# Patient Record
Sex: Male | Born: 1970 | Race: White | Hispanic: No | Marital: Married | Smoking: Never smoker
Health system: Southern US, Community
[De-identification: ages and names within clinical notes are randomized; demographics above are authoritative.]

## PROBLEM LIST (undated history)

## (undated) DIAGNOSIS — M8700 Idiopathic aseptic necrosis of unspecified bone: Secondary | ICD-10-CM

## (undated) DIAGNOSIS — I10 Essential (primary) hypertension: Secondary | ICD-10-CM

## (undated) DIAGNOSIS — S7290XA Unspecified fracture of unspecified femur, initial encounter for closed fracture: Secondary | ICD-10-CM

## (undated) DIAGNOSIS — K219 Gastro-esophageal reflux disease without esophagitis: Secondary | ICD-10-CM

## (undated) HISTORY — DX: Gastro-esophageal reflux disease without esophagitis: K21.9

---

## 2000-12-23 ENCOUNTER — Encounter: Payer: Self-pay | Admitting: *Deleted

## 2000-12-23 ENCOUNTER — Emergency Department (HOSPITAL_COMMUNITY): Admission: EM | Admit: 2000-12-23 | Discharge: 2000-12-24 | Payer: Self-pay | Admitting: *Deleted

## 2003-05-22 ENCOUNTER — Emergency Department (HOSPITAL_COMMUNITY): Admission: EM | Admit: 2003-05-22 | Discharge: 2003-05-22 | Payer: Self-pay | Admitting: Emergency Medicine

## 2003-05-22 ENCOUNTER — Encounter: Payer: Self-pay | Admitting: Emergency Medicine

## 2003-06-26 ENCOUNTER — Emergency Department (HOSPITAL_COMMUNITY): Admission: EM | Admit: 2003-06-26 | Discharge: 2003-06-26 | Payer: Self-pay | Admitting: Emergency Medicine

## 2003-07-23 ENCOUNTER — Emergency Department (HOSPITAL_COMMUNITY): Admission: AD | Admit: 2003-07-23 | Discharge: 2003-07-23 | Payer: Self-pay | Admitting: Family Medicine

## 2003-07-24 ENCOUNTER — Emergency Department (HOSPITAL_COMMUNITY): Admission: EM | Admit: 2003-07-24 | Discharge: 2003-07-24 | Payer: Self-pay | Admitting: Emergency Medicine

## 2004-03-18 ENCOUNTER — Emergency Department (HOSPITAL_COMMUNITY): Admission: EM | Admit: 2004-03-18 | Discharge: 2004-03-18 | Payer: Self-pay | Admitting: Emergency Medicine

## 2004-07-26 ENCOUNTER — Emergency Department (HOSPITAL_COMMUNITY): Admission: EM | Admit: 2004-07-26 | Discharge: 2004-07-26 | Payer: Self-pay | Admitting: Emergency Medicine

## 2005-03-25 ENCOUNTER — Emergency Department (HOSPITAL_COMMUNITY): Admission: EM | Admit: 2005-03-25 | Discharge: 2005-03-25 | Payer: Self-pay | Admitting: Emergency Medicine

## 2005-05-19 ENCOUNTER — Emergency Department (HOSPITAL_COMMUNITY): Admission: EM | Admit: 2005-05-19 | Discharge: 2005-05-19 | Payer: Self-pay | Admitting: Emergency Medicine

## 2005-06-16 ENCOUNTER — Emergency Department (HOSPITAL_COMMUNITY): Admission: EM | Admit: 2005-06-16 | Discharge: 2005-06-16 | Payer: Self-pay | Admitting: Emergency Medicine

## 2005-06-23 ENCOUNTER — Emergency Department (HOSPITAL_COMMUNITY): Admission: EM | Admit: 2005-06-23 | Discharge: 2005-06-23 | Payer: Self-pay | Admitting: Emergency Medicine

## 2005-07-12 ENCOUNTER — Emergency Department (HOSPITAL_COMMUNITY): Admission: EM | Admit: 2005-07-12 | Discharge: 2005-07-12 | Payer: Self-pay | Admitting: Emergency Medicine

## 2005-08-02 ENCOUNTER — Emergency Department (HOSPITAL_COMMUNITY): Admission: EM | Admit: 2005-08-02 | Discharge: 2005-08-02 | Payer: Self-pay | Admitting: Emergency Medicine

## 2005-09-07 ENCOUNTER — Emergency Department (HOSPITAL_COMMUNITY): Admission: EM | Admit: 2005-09-07 | Discharge: 2005-09-07 | Payer: Self-pay | Admitting: *Deleted

## 2005-10-04 ENCOUNTER — Emergency Department (HOSPITAL_COMMUNITY): Admission: EM | Admit: 2005-10-04 | Discharge: 2005-10-04 | Payer: Self-pay | Admitting: Emergency Medicine

## 2006-01-26 ENCOUNTER — Emergency Department (HOSPITAL_COMMUNITY): Admission: EM | Admit: 2006-01-26 | Discharge: 2006-01-26 | Payer: Self-pay | Admitting: Emergency Medicine

## 2006-08-09 ENCOUNTER — Emergency Department (HOSPITAL_COMMUNITY): Admission: EM | Admit: 2006-08-09 | Discharge: 2006-08-09 | Payer: Self-pay | Admitting: Emergency Medicine

## 2006-09-23 ENCOUNTER — Emergency Department (HOSPITAL_COMMUNITY): Admission: EM | Admit: 2006-09-23 | Discharge: 2006-09-23 | Payer: Self-pay | Admitting: Emergency Medicine

## 2006-11-27 ENCOUNTER — Emergency Department (HOSPITAL_COMMUNITY): Admission: EM | Admit: 2006-11-27 | Discharge: 2006-11-28 | Payer: Self-pay | Admitting: Emergency Medicine

## 2007-02-06 ENCOUNTER — Emergency Department (HOSPITAL_COMMUNITY): Admission: EM | Admit: 2007-02-06 | Discharge: 2007-02-06 | Payer: Self-pay | Admitting: Emergency Medicine

## 2007-03-02 ENCOUNTER — Emergency Department (HOSPITAL_COMMUNITY): Admission: EM | Admit: 2007-03-02 | Discharge: 2007-03-02 | Payer: Self-pay | Admitting: Emergency Medicine

## 2007-07-02 ENCOUNTER — Emergency Department (HOSPITAL_COMMUNITY): Admission: EM | Admit: 2007-07-02 | Discharge: 2007-07-02 | Payer: Self-pay | Admitting: Emergency Medicine

## 2007-09-09 ENCOUNTER — Emergency Department (HOSPITAL_COMMUNITY): Admission: EM | Admit: 2007-09-09 | Discharge: 2007-09-09 | Payer: Self-pay | Admitting: Emergency Medicine

## 2007-09-15 ENCOUNTER — Emergency Department (HOSPITAL_COMMUNITY): Admission: EM | Admit: 2007-09-15 | Discharge: 2007-09-15 | Payer: Self-pay | Admitting: Emergency Medicine

## 2007-10-16 ENCOUNTER — Emergency Department (HOSPITAL_COMMUNITY): Admission: EM | Admit: 2007-10-16 | Discharge: 2007-10-16 | Payer: Self-pay | Admitting: Emergency Medicine

## 2007-11-04 ENCOUNTER — Emergency Department (HOSPITAL_COMMUNITY): Admission: EM | Admit: 2007-11-04 | Discharge: 2007-11-04 | Payer: Self-pay | Admitting: Emergency Medicine

## 2007-11-05 ENCOUNTER — Emergency Department (HOSPITAL_COMMUNITY): Admission: EM | Admit: 2007-11-05 | Discharge: 2007-11-05 | Payer: Self-pay | Admitting: Emergency Medicine

## 2007-11-29 ENCOUNTER — Emergency Department (HOSPITAL_COMMUNITY): Admission: EM | Admit: 2007-11-29 | Discharge: 2007-11-29 | Payer: Self-pay | Admitting: Emergency Medicine

## 2007-12-16 ENCOUNTER — Emergency Department (HOSPITAL_COMMUNITY): Admission: EM | Admit: 2007-12-16 | Discharge: 2007-12-16 | Payer: Self-pay | Admitting: Emergency Medicine

## 2008-03-13 ENCOUNTER — Emergency Department (HOSPITAL_COMMUNITY): Admission: EM | Admit: 2008-03-13 | Discharge: 2008-03-13 | Payer: Self-pay | Admitting: Emergency Medicine

## 2008-10-17 ENCOUNTER — Emergency Department (HOSPITAL_COMMUNITY): Admission: EM | Admit: 2008-10-17 | Discharge: 2008-10-17 | Payer: Self-pay | Admitting: Emergency Medicine

## 2010-09-16 DIAGNOSIS — S7290XA Unspecified fracture of unspecified femur, initial encounter for closed fracture: Secondary | ICD-10-CM

## 2010-09-16 HISTORY — PX: FEMUR FRACTURE SURGERY: SHX633

## 2010-09-16 HISTORY — DX: Unspecified fracture of unspecified femur, initial encounter for closed fracture: S72.90XA

## 2011-09-17 HISTORY — PX: FRACTURE SURGERY: SHX138

## 2012-02-21 ENCOUNTER — Encounter (HOSPITAL_COMMUNITY): Payer: Self-pay | Admitting: *Deleted

## 2012-02-21 ENCOUNTER — Emergency Department (HOSPITAL_COMMUNITY)
Admission: EM | Admit: 2012-02-21 | Discharge: 2012-02-21 | Disposition: A | Discharge status: Home / Self Care | Payer: Self-pay | Attending: Emergency Medicine | Admitting: Emergency Medicine

## 2012-02-21 ENCOUNTER — Emergency Department (HOSPITAL_COMMUNITY): Payer: Self-pay

## 2012-02-21 DIAGNOSIS — M25569 Pain in unspecified knee: Secondary | ICD-10-CM | POA: Insufficient documentation

## 2012-02-21 DIAGNOSIS — G8929 Other chronic pain: Secondary | ICD-10-CM | POA: Insufficient documentation

## 2012-02-21 HISTORY — DX: Unspecified fracture of unspecified femur, initial encounter for closed fracture: S72.90XA

## 2012-02-21 MED ORDER — OXYCODONE-ACETAMINOPHEN 5-325 MG PO TABS
2.0000 | ORAL_TABLET | Freq: Once | ORAL | Status: AC
  Administered 2012-02-21: 2 via ORAL
  Filled 2012-02-21: qty 2

## 2012-02-21 MED ORDER — IBUPROFEN 800 MG PO TABS: 800.0000 mg | ORAL_TABLET | Freq: Three times a day (TID) | ORAL | Status: AC

## 2012-02-21 MED ORDER — HYDROCODONE-ACETAMINOPHEN 5-325 MG PO TABS: 2.0000 | ORAL_TABLET | ORAL | Status: AC | PRN

## 2012-02-21 NOTE — ED Notes (Signed)
Pt states that he broke his knee and femur on halloween of this past year. Pt continues to have pain in right knee area.

## 2012-02-21 NOTE — Discharge Instructions (Signed)
Knee Pain Followup with your knee specialist as scheduled. Return to the ED if you have new or worsening symptoms. The knee is the complex joint between your thigh and your lower leg. It is made up of bones, tendons, ligaments, and cartilage. The bones that make up the knee are:  The femur in the thigh.   The tibia and fibula in the lower leg.   The patella or kneecap riding in the groove on the lower femur.  CAUSES  Knee pain is a common complaint with many causes. A few of these causes are:  Injury, such as:   A ruptured ligament or tendon injury.   Torn cartilage.   Medical conditions, such as:   Gout   Arthritis   Infections   Overuse, over training or overdoing a physical activity.  Knee pain can be minor or severe. Knee pain can accompany debilitating injury. Minor knee problems often respond well to self-care measures or get well on their own. More serious injuries may need medical intervention or even surgery. SYMPTOMS The knee is complex. Symptoms of knee problems can vary widely. Some of the problems are:  Pain with movement and weight bearing.   Swelling and tenderness.   Buckling of the knee.   Inability to straighten or extend your knee.   Your knee locks and you cannot straighten it.   Warmth and redness with pain and fever.   Deformity or dislocation of the kneecap.  DIAGNOSIS  Determining what is wrong may be very straight forward such as when there is an injury. It can also be challenging because of the complexity of the knee. Tests to make a diagnosis may include:  Your caregiver taking a history and doing a physical exam.   Routine X-rays can be used to rule out other problems. X-rays will not reveal a cartilage tear. Some injuries of the knee can be diagnosed by:   Arthroscopy a surgical technique by which a small video camera is inserted through tiny incisions on the sides of the knee. This procedure is used to examine and repair internal knee  joint problems. Tiny instruments can be used during arthroscopy to repair the torn knee cartilage (meniscus).   Arthrography is a radiology technique. A contrast liquid is directly injected into the knee joint. Internal structures of the knee joint then become visible on X-ray film.   An MRI scan is a non x-ray radiology procedure in which magnetic fields and a computer produce two- or three-dimensional images of the inside of the knee. Cartilage tears are often visible using an MRI scanner. MRI scans have largely replaced arthrography in diagnosing cartilage tears of the knee.   Blood work.   Examination of the fluid that helps to lubricate the knee joint (synovial fluid). This is done by taking a sample out using a needle and a syringe.  TREATMENT The treatment of knee problems depends on the cause. Some of these treatments are:  Depending on the injury, proper casting, splinting, surgery or physical therapy care will be needed.   Give yourself adequate recovery time. Do not overuse your joints. If you begin to get sore during workout routines, back off. Slow down or do fewer repetitions.   For repetitive activities such as cycling or running, maintain your strength and nutrition.   Alternate muscle groups. For example if you are a weight lifter, work the upper body on one day and the lower body the next.   Either tight or weak muscles do  not give the proper support for your knee. Tight or weak muscles do not absorb the stress placed on the knee joint. Keep the muscles surrounding the knee strong.   Take care of mechanical problems.   If you have flat feet, orthotics or special shoes may help. See your caregiver if you need help.   Arch supports, sometimes with wedges on the inner or outer aspect of the heel, can help. These can shift pressure away from the side of the knee most bothered by osteoarthritis.   A brace called an "unloader" brace also may be used to help ease the pressure  on the most arthritic side of the knee.   If your caregiver has prescribed crutches, braces, wraps or ice, use as directed. The acronym for this is PRICE. This means protection, rest, ice, compression and elevation.   Nonsteroidal anti-inflammatory drugs (NSAID's), can help relieve pain. But if taken immediately after an injury, they may actually increase swelling. Take NSAID's with food in your stomach. Stop them if you develop stomach problems. Do not take these if you have a history of ulcers, stomach pain or bleeding from the bowel. Do not take without your caregiver's approval if you have problems with fluid retention, heart failure, or kidney problems.   For ongoing knee problems, physical therapy may be helpful.   Glucosamine and chondroitin are over-the-counter dietary supplements. Both may help relieve the pain of osteoarthritis in the knee. These medicines are different from the usual anti-inflammatory drugs. Glucosamine may decrease the rate of cartilage destruction.   Injections of a corticosteroid drug into your knee joint may help reduce the symptoms of an arthritis flare-up. They may provide pain relief that lasts a few months. You may have to wait a few months between injections. The injections do have a small increased risk of infection, water retention and elevated blood sugar levels.   Hyaluronic acid injected into damaged joints may ease pain and provide lubrication. These injections may work by reducing inflammation. A series of shots may give relief for as long as 6 months.   Topical painkillers. Applying certain ointments to your skin may help relieve the pain and stiffness of osteoarthritis. Ask your pharmacist for suggestions. Many over the-counter products are approved for temporary relief of arthritis pain.   In some countries, doctors often prescribe topical NSAID's for relief of chronic conditions such as arthritis and tendinitis. A review of treatment with NSAID creams  found that they worked as well as oral medications but without the serious side effects.  PREVENTION  Maintain a healthy weight. Extra pounds put more strain on your joints.   Get strong, stay limber. Weak muscles are a common cause of knee injuries. Stretching is important. Include flexibility exercises in your workouts.   Be smart about exercise. If you have osteoarthritis, chronic knee pain or recurring injuries, you may need to change the way you exercise. This does not mean you have to stop being active. If your knees ache after jogging or playing basketball, consider switching to swimming, water aerobics or other low-impact activities, at least for a few days a week. Sometimes limiting high-impact activities will provide relief.   Make sure your shoes fit well. Choose footwear that is right for your sport.   Protect your knees. Use the proper gear for knee-sensitive activities. Use kneepads when playing volleyball or laying carpet. Buckle your seat belt every time you drive. Most shattered kneecaps occur in car accidents.   Rest when you are  tired.  SEEK MEDICAL CARE IF:  You have knee pain that is continual and does not seem to be getting better.  SEEK IMMEDIATE MEDICAL CARE IF:  Your knee joint feels hot to the touch and you have a high fever. MAKE SURE YOU:   Understand these instructions.   Will watch your condition.   Will get help right away if you are not doing well or get worse.  Document Released: 06/30/2007 Document Revised: 08/22/2011 Document Reviewed: 06/30/2007 [facility] Patient Information 2012 [place], [place].

## 2012-02-21 NOTE — ED Notes (Signed)
Pt c/o chronic pain in his right knee since breaking it in October. States that he has an appt with a specialist at the end of the month. Pt alert and oriented x 3. Skin warm and dry. Color pink.

## 2012-02-21 NOTE — ED Provider Notes (Signed)
History   This chart was scribed for Troy Octave, MD by Charolett Bumpers . The patient was seen in room APFT21/APFT21.    CSN: [id number]  Arrival date & time 02/21/12  1443   First MD Initiated Contact with Patient 02/21/12 1615      Chief Complaint  Patient presents with  . Leg Pain    (Consider location/radiation/quality/duration/timing/severity/associated sxs/prior treatment) HPI Troy Hunt is a 41 y.o. male who presents to the Emergency Department complaining of constant, moderate right knee pain since Oct/2012. Patient states that he fell and broke his right femur in which he had surgery for. Patient states that his pain has been the same pain since the surgery. Patient states that Dr. Lorin Picket at [facility] preformed the surgery and has been following him until 01/25/2012 which was his last check-up. Patient states that Dr. Lorin Picket referred him to a knee specialist, Dr. Emelda Fear. Patient states that he is not currently taking any medications for his knee pain, since he ran out after his last check up. Patient denies any other associated symptoms or injuries at this time. No prior medical or surgical hx reported.     Past Medical History  Diagnosis Date  . Closed femur fracture     Past Surgical History  Procedure Date  . Femur fracture surgery     No family history on file.  History  Substance Use Topics  . Smoking status: Never Smoker   . Smokeless tobacco: Not on file  . Alcohol Use: No      Review of Systems  Constitutional: Negative for fever.  Gastrointestinal: Negative for vomiting.  Genitourinary:       No incontinence.   Musculoskeletal: Negative for joint swelling.       Right knee pain.   Neurological: Negative for weakness and numbness.  All other systems reviewed and are negative.    Allergies  Darvocet; Ultracet; and Ultram  Home Medications   Current Outpatient Rx  Name Route Sig Dispense Refill  . NAPROXEN SODIUM 220 MG PO TABS  Oral Take 220 mg by mouth 2 (two) times daily as needed. Pain    . HYDROCODONE-ACETAMINOPHEN 5-325 MG PO TABS Oral Take 2 tablets by mouth every 4 (four) hours as needed for pain. 10 tablet 0  . IBUPROFEN 800 MG PO TABS Oral Take 1 tablet (800 mg total) by mouth 3 (three) times daily. 21 tablet 0    BP 178/106  Pulse 95  Temp(Src) 98 F (36.7 C) (Oral)  Resp 20  Ht 5\' 9"  (1.753 m)  Wt 204 lb (92.534 kg)  BMI 30.13 kg/m2  SpO2 98%  Physical Exam  Nursing note and vitals reviewed. Constitutional: He is oriented to person, place, and time. He appears well-developed and well-nourished. No distress.  HENT:  Head: Normocephalic and atraumatic.  Eyes: EOM are normal. Pupils are equal, round, and reactive to light.  Neck: Neck supple. No tracheal deviation present.  Cardiovascular: Normal rate.   Pulmonary/Chest: Effort normal. No respiratory distress.  Abdominal: Soft. He exhibits no distension.  Musculoskeletal: Normal range of motion. He exhibits no edema.       Right knee: He exhibits normal range of motion, no effusion, no LCL laxity and no MCL laxity.       Crepitus right lateral knee. Well healed scar to Rt anterior knee, right lateral thigh. Full ROM with no effusion. No ligament laxity. No weakness   Neurological: He is alert and oriented to person, place, and time.  No sensory deficit.  Skin: Skin is warm and dry.  Psychiatric: He has a normal mood and affect. His behavior is normal.    ED Course  Procedures (including critical care time)  DIAGNOSTIC STUDIES: Oxygen Saturation is 98% on room air, normal by my interpretation.    COORDINATION OF CARE:  1624: Discussed planned course of treatment with the patient, who is agreeable at this time.  1630: Medication Orders: Oxycodone-acetaminophen (Percocet) 5-325 mg per tablet 2 tablet-once.    Labs Reviewed - No data to display Dg Femur Right  02/21/2012  *RADIOLOGY REPORT*  Clinical Data: Right thigh and knee pain.  RIGHT  FEMUR - 2 VIEW  Comparison: None.  Findings: Two-view exam of the right femur shows no evidence for fracture.  Lateral plate screw fixation device is noted, without evidence for hardware complications.  No evidence for joint effusion.  IMPRESSION: Status post ORIF.  No acute findings.  Original Report Authenticated By: ERIC A. MANSELL, M.D.   Dg Knee Complete 4 Views Right  02/21/2012  *RADIOLOGY REPORT*  Clinical Data: Knee pain.  RIGHT KNEE - COMPLETE 4+ VIEW  Comparison: None.  Findings: The study was reviewed along with the femur films obtained at the same time.  No fracture.  No hardware complications in this patient with a lateral plate and screw fixation device on the distal femur.  No evidence for joint effusion.  IMPRESSION: No acute bony findings.  Original Report Authenticated By: ERIC A. MANSELL, M.D.     1. Chronic knee pain       MDM  Chronic R leg and knee pain since surgery in Oct 2012.  No new injury. Incisions well healed. No effusion, weakness, numbness, tingling, fever. Crepitance to R knee on exam without ligament laxity. No evidence of septic joint.  No hardware failure on Xray. Follow up with knee specialist at [facility] as scheduled on June 21. Provided with short course of pain meds.  I personally performed the services described in this documentation, which was scribed in my presence.  The recorded information has been reviewed and considered.       Troy Octave, MD 02/22/12 1209

## 2012-09-16 HISTORY — PX: JOINT REPLACEMENT: SHX530

## 2012-12-10 ENCOUNTER — Emergency Department (HOSPITAL_COMMUNITY)
Admission: EM | Admit: 2012-12-10 | Discharge: 2012-12-10 | Disposition: A | Discharge status: Home / Self Care | Payer: Self-pay | Attending: Emergency Medicine | Admitting: Emergency Medicine

## 2012-12-10 ENCOUNTER — Encounter (HOSPITAL_COMMUNITY): Payer: Self-pay

## 2012-12-10 DIAGNOSIS — G8929 Other chronic pain: Secondary | ICD-10-CM | POA: Insufficient documentation

## 2012-12-10 DIAGNOSIS — Z8781 Personal history of (healed) traumatic fracture: Secondary | ICD-10-CM | POA: Insufficient documentation

## 2012-12-10 DIAGNOSIS — M2550 Pain in unspecified joint: Secondary | ICD-10-CM | POA: Insufficient documentation

## 2012-12-10 MED ORDER — OXYCODONE-ACETAMINOPHEN 5-325 MG PO TABS: 1.0000 | ORAL_TABLET | Freq: Four times a day (QID) | ORAL | Status: DC | PRN

## 2012-12-10 MED ORDER — DICLOFENAC SODIUM 75 MG PO TBEC: 75.0000 mg | DELAYED_RELEASE_TABLET | Freq: Two times a day (BID) | ORAL | Status: DC

## 2012-12-10 NOTE — ED Notes (Signed)
Pt reports that he has been out of his percocet for pain since feb 27th, was last seen by his pmd  feb 11th, and they will not see him anymore due to lack of insurance.  Has chronic right leg pain from injury 1 year ago that required surgery.

## 2012-12-10 NOTE — ED Provider Notes (Signed)
Medical screening examination/treatment/procedure(s) were performed by non-physician practitioner and as supervising physician I was immediately available for consultation/collaboration.  Joelys Staubs L Alyan Hartline, MD 12/10/12 1613 

## 2012-12-10 NOTE — ED Provider Notes (Signed)
History     CSN: [id number]  Arrival date & time 12/10/12  1202   First MD Initiated Contact with Patient 12/10/12 1243      Chief Complaint  Patient presents with  . Medication Refill    (Consider location/radiation/quality/duration/timing/severity/associated sxs/prior treatment) HPI Comments: Patient is a 42 year old male who unfortunately sustained a fracture of the right femur proximally 1-1/2 years ago. He has been followed with an orthopedic specialist, and a" knee specialist", and a pain management person up until February 11th.. Patient states that he lost his insurance and after that did not see the doctors in [place]. It is of note that his sister works in [place] and has found a physician at a clinic with [facility] that will see the patient. The patient is scheduled for appointment, but states that he cannot take the pain between now and the appointment time. He had been taking Percocet for his pain, but has not had this medication since February 27. Patient presents to the emergency department to request assistance with his pain.  The history is provided by the patient.    Past Medical History  Diagnosis Date  . Closed femur fracture     Past Surgical History  Procedure Laterality Date  . Femur fracture surgery      No family history on file.  History  Substance Use Topics  . Smoking status: Never Smoker   . Smokeless tobacco: Not on file  . Alcohol Use: No      Review of Systems  Constitutional: Negative for activity change.       All ROS Neg except as noted in HPI  HENT: Negative for nosebleeds and neck pain.   Eyes: Negative for photophobia and discharge.  Respiratory: Negative for cough, shortness of breath and wheezing.   Cardiovascular: Negative for chest pain and palpitations.  Gastrointestinal: Negative for abdominal pain and blood in stool.  Genitourinary: Negative for dysuria, frequency and hematuria.  Musculoskeletal: Positive for  arthralgias. Negative for back pain.  Skin: Negative.   Neurological: Negative for dizziness, seizures and speech difficulty.  Psychiatric/Behavioral: Negative for hallucinations and confusion.    Allergies  Darvocet; Ultracet; and Ultram  Home Medications   Current Outpatient Rx  Name  Route  Sig  Dispense  Refill  . naproxen sodium (ALEVE) 220 MG tablet   Oral   Take 220 mg by mouth 2 (two) times daily as needed. Pain           BP 174/100  Pulse 102  Temp(Src) 97.6 F (36.4 C) (Oral)  Resp 20  Ht 5\' 9"  (1.753 m)  Wt 200 lb (90.719 kg)  BMI 29.52 kg/m2  SpO2 100%  Physical Exam  Nursing note and vitals reviewed. Constitutional: He is oriented to person, place, and time. He appears well-developed and well-nourished.  Non-toxic appearance.  HENT:  Head: Normocephalic.  Right Ear: Tympanic membrane and external ear normal.  Left Ear: Tympanic membrane and external ear normal.  Eyes: EOM and lids are normal. Pupils are equal, round, and reactive to light.  Neck: Normal range of motion. Neck supple. Carotid bruit is not present.  Cardiovascular: Regular rhythm, normal heart sounds, intact distal pulses and normal pulses.  Tachycardia present.   Pulmonary/Chest: Breath sounds normal. No respiratory distress.  Abdominal: Soft. Bowel sounds are normal. There is no tenderness. There is no guarding.  Musculoskeletal: Normal range of motion.  There is decreased range of motion of the right knee and ankle. There no hot joints  noted at the right hip, right knee, or right ankle. The dorsalis pedis pulses 2+. There no hot areas in the thigh area. There is pain with attempted range of motion.  Lymphadenopathy:       Head (right side): No submandibular adenopathy present.       Head (left side): No submandibular adenopathy present.    He has no cervical adenopathy.  Neurological: He is alert and oriented to person, place, and time. He has normal strength. No cranial nerve deficit or  sensory deficit.  Skin: Skin is warm and dry.  Psychiatric: He has a normal mood and affect. His speech is normal.    ED Course  Procedures (including critical care time)  Labs Reviewed - No data to display No results found.   No diagnosis found.    MDM  I have reviewed nursing notes, vital signs, and all appropriate lab and imaging results for this patient.  The patient sustained a fracture to the right femur approximately 1-1/2 years ago. He has been followed by a team of specialists in the [facility] area up until February 11. He is scheduled to see a clinic specialist in [place], but states that he could not take the pain between now and the time of his appointment.  Vital signs are stable. There is no acute changes noted on examination. The plan at this time is for the patient to receive one day of diclofenac, and one day of Percocet. Patient advised that he should see the team of specialists that he is seeing in [facility] for pain management, as the emergency department is not set up for pain management. Patient acknowledges understanding of the plan.      Kathie Dike, PA-C 12/10/12 1250

## 2012-12-14 ENCOUNTER — Emergency Department (HOSPITAL_COMMUNITY)
Admission: EM | Admit: 2012-12-14 | Discharge: 2012-12-14 | Disposition: A | Discharge status: Home / Self Care | Payer: Self-pay | Attending: Emergency Medicine | Admitting: Emergency Medicine

## 2012-12-14 ENCOUNTER — Encounter (HOSPITAL_COMMUNITY): Payer: Self-pay | Admitting: *Deleted

## 2012-12-14 ENCOUNTER — Emergency Department (HOSPITAL_COMMUNITY)
Admission: EM | Admit: 2012-12-14 | Discharge: 2012-12-14 | Discharge status: Against Medical Advice | Payer: Self-pay | Attending: Emergency Medicine | Admitting: Emergency Medicine

## 2012-12-14 DIAGNOSIS — M549 Dorsalgia, unspecified: Secondary | ICD-10-CM

## 2012-12-14 DIAGNOSIS — Z87828 Personal history of other (healed) physical injury and trauma: Secondary | ICD-10-CM | POA: Insufficient documentation

## 2012-12-14 DIAGNOSIS — X500XXA Overexertion from strenuous movement or load, initial encounter: Secondary | ICD-10-CM | POA: Insufficient documentation

## 2012-12-14 DIAGNOSIS — S161XXA Strain of muscle, fascia and tendon at neck level, initial encounter: Secondary | ICD-10-CM

## 2012-12-14 DIAGNOSIS — S139XXA Sprain of joints and ligaments of unspecified parts of neck, initial encounter: Secondary | ICD-10-CM | POA: Insufficient documentation

## 2012-12-14 DIAGNOSIS — Y939 Activity, unspecified: Secondary | ICD-10-CM | POA: Insufficient documentation

## 2012-12-14 DIAGNOSIS — Z79899 Other long term (current) drug therapy: Secondary | ICD-10-CM | POA: Insufficient documentation

## 2012-12-14 DIAGNOSIS — Y929 Unspecified place or not applicable: Secondary | ICD-10-CM | POA: Insufficient documentation

## 2012-12-14 DIAGNOSIS — S99919A Unspecified injury of unspecified ankle, initial encounter: Secondary | ICD-10-CM | POA: Insufficient documentation

## 2012-12-14 DIAGNOSIS — IMO0002 Reserved for concepts with insufficient information to code with codable children: Secondary | ICD-10-CM | POA: Insufficient documentation

## 2012-12-14 DIAGNOSIS — I10 Essential (primary) hypertension: Secondary | ICD-10-CM

## 2012-12-14 DIAGNOSIS — S8990XA Unspecified injury of unspecified lower leg, initial encounter: Secondary | ICD-10-CM | POA: Insufficient documentation

## 2012-12-14 HISTORY — DX: Essential (primary) hypertension: I10

## 2012-12-14 MED ORDER — CYCLOBENZAPRINE HCL 10 MG PO TABS: 10.0000 mg | ORAL_TABLET | Freq: Three times a day (TID) | ORAL | Status: DC | PRN

## 2012-12-14 MED ORDER — HYDROCODONE-ACETAMINOPHEN 5-325 MG PO TABS: ORAL_TABLET | ORAL | Status: DC

## 2012-12-14 NOTE — ED Provider Notes (Signed)
History     CSN: [id number]  Arrival date & time 12/14/12  1916   First MD Initiated Contact with Patient 12/14/12 1926      Chief Complaint  Patient presents with  . Back Pain    (Consider location/radiation/quality/duration/timing/severity/associated sxs/prior treatment) HPI Comments: Patient c/o right lower back pain and right neck pain that began yesterday after a twisting injury.  He states the pain to his lower back radiates into his right thigh.  Describes the pain as sharp and burning at times.  He also c/o pain to the right side of his neck that also began after the injury.  He describes the pain as a "crick in my neck".  Pain to his neck is worse when he attempts to move his neck to the right.  He denies fever, vomiting, weakness, numbness of the extremities, incontinence or dysuria.    Patient is a 42 y.o. male presenting with back pain. The history is provided by the patient.  Back Pain Location:  Lumbar spine Quality:  Aching and cramping Radiates to:  R posterior upper leg and R thigh Pain severity:  Moderate Pain is:  Same all the time Onset quality:  Sudden Duration:  1 day Timing:  Constant Progression:  Worsening Chronicity:  Recurrent Context: recent injury and twisting   Context: not falling, not lifting heavy objects, not MVA, not physical stress and not recent illness   Relieved by:  Nothing Worsened by:  Ambulation, bending and twisting Ineffective treatments:  None tried Associated symptoms: leg pain   Associated symptoms: no abdominal pain, no abdominal swelling, no bladder incontinence, no bowel incontinence, no chest pain, no dysuria, no fever, no headaches, no numbness, no paresthesias, no pelvic pain, no perianal numbness, no tingling and no weakness     Past Medical History  Diagnosis Date  . Closed femur fracture   . Hypertension     Past Surgical History  Procedure Laterality Date  . Femur fracture surgery      History reviewed. No  pertinent family history.  History  Substance Use Topics  . Smoking status: Never Smoker   . Smokeless tobacco: Not on file  . Alcohol Use: No      Review of Systems  Constitutional: Negative for fever, activity change and appetite change.  HENT: Positive for neck pain. Negative for ear pain, sore throat, facial swelling, trouble swallowing and neck stiffness.   Respiratory: Negative for shortness of breath.   Cardiovascular: Negative for chest pain.  Gastrointestinal: Negative for vomiting, abdominal pain, constipation and bowel incontinence.  Genitourinary: Negative for bladder incontinence, dysuria, hematuria, flank pain, decreased urine volume, difficulty urinating and pelvic pain.       No perineal numbness or incontinence of urine or feces  Musculoskeletal: Positive for back pain. Negative for joint swelling.  Skin: Negative for rash.  Neurological: Negative for tingling, weakness, numbness, headaches and paresthesias.  Hematological: Negative for adenopathy.  All other systems reviewed and are negative.    Allergies  Darvocet; Ultracet; and Ultram  Home Medications   Current Outpatient Rx  Name  Route  Sig  Dispense  Refill  . Acetaminophen-Aspirin Buffered (EXCEDRIN BACK & BODY) 250-250 MG tablet   Oral   Take 2 tablets by mouth once as needed for fever or pain.         Marland Kitchen diclofenac (VOLTAREN) 75 MG EC tablet   Oral   Take 1 tablet (75 mg total) by mouth 2 (two) times daily.   6  tablet   0   . oxyCODONE-acetaminophen (PERCOCET/ROXICET) 5-325 MG per tablet   Oral   Take 1 tablet by mouth every 6 (six) hours as needed for pain.   6 tablet   0     BP 171/113  Pulse 91  Temp(Src) 97.7 F (36.5 C) (Oral)  Resp 20  Ht 5\' 9"  (1.753 m)  Wt 190 lb (86.183 kg)  BMI 28.05 kg/m2  SpO2 97%  Physical Exam  Nursing note and vitals reviewed. Constitutional: He is oriented to person, place, and time. He appears well-developed and well-nourished. No distress.    HENT:  Head: Normocephalic and atraumatic.  Neck: Phonation normal. Neck supple. Muscular tenderness present. No spinous process tenderness present. No Brudzinski's sign and no Kernig's sign noted.    ttp of the right cervical paraspinal muscles and right trapezius muscles.  Distal sensation intact,.  Grip strength is strong and symmetrical.    Cardiovascular: Normal rate, regular rhythm, normal heart sounds and intact distal pulses.   No murmur heard. Pulmonary/Chest: Effort normal and breath sounds normal.  Musculoskeletal: He exhibits tenderness. He exhibits no edema.       Lumbar back: He exhibits tenderness and pain. He exhibits normal range of motion, no bony tenderness, no swelling, no deformity, no laceration and normal pulse.       Back:  ttp of the right lumbar paraspinal muscles.  DP pulses are brisk and symmetrical, distal sensation intact  Lymphadenopathy:    He has no cervical adenopathy.  Neurological: He is alert and oriented to person, place, and time. No cranial nerve deficit or sensory deficit. He exhibits normal muscle tone. Coordination and gait normal.  Reflex Scores:      Patellar reflexes are 2+ on the right side and 2+ on the left side.      Achilles reflexes are 2+ on the right side and 2+ on the left side. Skin: Skin is warm and dry.    ED Course  Procedures (including critical care time)  Labs Reviewed - No data to display No results found.      MDM  Previous ED charts, nursing notes and ED charts were reviewed and considered.  Patient reviewed on the [place] narcotics database.  Received #80 hydrocodone 5/325mg  on 11/17/12  Has ttp of the right lumbar paraspinal muscles and right SI joint space. No focal neuro deficits on exam.   Doubt emergent neurological or infectious process.  Will prescribe flexeril and norco #12.  Pt advised that he will need further pain management with his PMD.    I have also advised the patient of his blood pressure readings  today.  He states that he is taking his prescribed BP medication daily and has not missed any recent doses.  He denies CP, dyspnea, paraesthesia's, headache, visual changes or dizziness.  He agrees to close f/u with his PMD this week regarding his HTN.    The patient appears reasonably screened and/or stabilized for discharge and I doubt any other medical condition or other [facility] requiring further screening, evaluation, or treatment in the ED at this time prior to discharge.     Celie Desrochers L. Reah Justo, PA-C 12/15/12 0037

## 2012-12-14 NOTE — ED Notes (Signed)
Twisted back yesterday . Pain in low back with radiation down rt leg and "crick in my neck"

## 2012-12-14 NOTE — ED Notes (Addendum)
Pain rt side of back, down rt leg and rt upper back, working yesterday and twisted his back.  Pt says he has been taking his bp med as ordered

## 2012-12-14 NOTE — ED Notes (Signed)
No answer

## 2012-12-15 NOTE — ED Provider Notes (Signed)
Medical screening examination/treatment/procedure(s) were performed by non-physician practitioner and as supervising physician I was immediately available for consultation/collaboration.   Laray Anger, DO 12/15/12 1004

## 2012-12-23 ENCOUNTER — Emergency Department (HOSPITAL_COMMUNITY)
Admission: EM | Admit: 2012-12-23 | Discharge: 2012-12-23 | Disposition: A | Discharge status: Home / Self Care | Payer: Self-pay | Attending: Emergency Medicine | Admitting: Emergency Medicine

## 2012-12-23 ENCOUNTER — Encounter (HOSPITAL_COMMUNITY): Payer: Self-pay

## 2012-12-23 ENCOUNTER — Emergency Department (HOSPITAL_COMMUNITY): Payer: Self-pay

## 2012-12-23 DIAGNOSIS — M25469 Effusion, unspecified knee: Secondary | ICD-10-CM | POA: Insufficient documentation

## 2012-12-23 DIAGNOSIS — Z8781 Personal history of (healed) traumatic fracture: Secondary | ICD-10-CM | POA: Insufficient documentation

## 2012-12-23 DIAGNOSIS — M25561 Pain in right knee: Secondary | ICD-10-CM

## 2012-12-23 DIAGNOSIS — I10 Essential (primary) hypertension: Secondary | ICD-10-CM | POA: Insufficient documentation

## 2012-12-23 DIAGNOSIS — Z79899 Other long term (current) drug therapy: Secondary | ICD-10-CM | POA: Insufficient documentation

## 2012-12-23 DIAGNOSIS — M25569 Pain in unspecified knee: Secondary | ICD-10-CM | POA: Insufficient documentation

## 2012-12-23 DIAGNOSIS — G8911 Acute pain due to trauma: Secondary | ICD-10-CM | POA: Insufficient documentation

## 2012-12-23 MED ORDER — OXYCODONE-ACETAMINOPHEN 5-325 MG PO TABS: 1.0000 | ORAL_TABLET | ORAL | Status: DC | PRN

## 2012-12-23 MED ORDER — OXYCODONE-ACETAMINOPHEN 5-325 MG PO TABS
1.0000 | ORAL_TABLET | Freq: Once | ORAL | Status: AC
  Administered 2012-12-23: 1 via ORAL
  Filled 2012-12-23: qty 1

## 2012-12-23 MED ORDER — NAPROXEN 500 MG PO TABS: 500.0000 mg | ORAL_TABLET | Freq: Two times a day (BID) | ORAL | Status: DC

## 2012-12-23 NOTE — ED Provider Notes (Signed)
History     CSN: [id number]  Arrival date & time 12/23/12  8469   First MD Initiated Contact with Patient 12/23/12 1022      Chief Complaint  Patient presents with  . Knee Pain    (Consider location/radiation/quality/duration/timing/severity/associated sxs/prior treatment) HPI Comments: Patient with hx of previous fracture to the distal right femur c/o sudden onset of right knee pain on the day prior to ED arrival.  States that he was driving when he felt a sharp , throbbing pain to his knee.  He also c/o increased swelling and worsening pain to the knee with weight bearing.  Pain improves with rest.  He denies radiation of pain, redness , fever, chills or calf pain.    Patient is a 42 y.o. male presenting with knee pain. The history is provided by the patient.  Knee Pain Location:  Knee Time since incident:  1 day Injury: no   Knee location:  R knee Pain details:    Quality:  Aching, throbbing and sharp   Radiates to:  Does not radiate   Severity:  Moderate   Onset quality:  Sudden   Timing:  Constant   Progression:  Unchanged Chronicity:  Recurrent Dislocation: no   Foreign body present:  No foreign bodies Prior injury to area:  Yes Relieved by:  Nothing Worsened by:  Bearing weight, flexion and activity Ineffective treatments:  None tried Associated symptoms: decreased ROM and swelling   Associated symptoms: no back pain, no fever, no itching, no muscle weakness, no neck pain, no numbness, no stiffness and no tingling     Past Medical History  Diagnosis Date  . Closed femur fracture   . Hypertension     Past Surgical History  Procedure Laterality Date  . Femur fracture surgery      No family history on file.  History  Substance Use Topics  . Smoking status: Never Smoker   . Smokeless tobacco: Not on file  . Alcohol Use: No      Review of Systems  Constitutional: Negative for fever and chills.  HENT: Negative for neck pain.   Genitourinary: Negative  for dysuria and difficulty urinating.  Musculoskeletal: Positive for joint swelling and arthralgias. Negative for back pain and stiffness.  Skin: Negative for color change, itching and wound.  All other systems reviewed and are negative.    Allergies  Darvocet; Ultracet; and Ultram  Home Medications   Current Outpatient Rx  Name  Route  Sig  Dispense  Refill  . acetaminophen (TYLENOL) 500 MG tablet   Oral   Take 1,000 mg by mouth every 6 (six) hours as needed for pain.         Marland Kitchen lisinopril-hydrochlorothiazide (PRINZIDE,ZESTORETIC) 20-25 MG per tablet   Oral   Take 1 tablet by mouth every morning.           BP 174/105  Pulse 85  Temp(Src) 97.4 F (36.3 C) (Oral)  Resp 20  Ht 5\' 9"  (1.753 m)  Wt 190 lb (86.183 kg)  BMI 28.05 kg/m2  SpO2 99%  Physical Exam  Nursing note and vitals reviewed. Constitutional: He is oriented to person, place, and time. He appears well-developed and well-nourished. No distress.  HENT:  Head: Normocephalic and atraumatic.  Cardiovascular: Normal rate, regular rhythm, normal heart sounds and intact distal pulses.   No murmur heard. Pulmonary/Chest: Effort normal and breath sounds normal.  Musculoskeletal: He exhibits edema and tenderness.  ttp of the anterior right patella and patella  tendon.  moderate crepitus.  No erythema, bruising, effusion or step-off deformity.  Distal sensation intact, DP pulse is brisk  Neurological: He is alert and oriented to person, place, and time. He exhibits normal muscle tone. Coordination normal.  Skin: Skin is warm and dry. No erythema.    ED Course  Procedures (including critical care time)  Labs Reviewed - No data to display Dg Knee Complete 4 Views Right  12/23/2012  *RADIOLOGY REPORT*  Clinical Data: Knee pain.  History of fracture and fixation.  RIGHT KNEE - COMPLETE 4+ VIEW  Comparison: Radiographs 02/21/2012.  Findings: The femoral hardware has been removed in the interval. The bones appear mildly  demineralized.  There is no evidence of acute fracture or dislocation.  The joint spaces are adequately maintained.  There is a possible small knee joint effusion.  IMPRESSION: No acute osseous findings status post removal of femoral fixation hardware.  Possible small knee joint effusion.   Original Report Authenticated By: Carey Bullocks, M.D.     Knee immob applied, pain improved, remains NV intact    MDM   Knee immobilizer applied and crutches given.  Moderate patellar crepitus and ttp of the patella tendon.  Pt is able to lift and extend the lower leg.  No step-off deformity seen.  No erythema, excessive warmth or obvious effusion to suggest septic joint.   Pt agree to elevate, ice and close f/u with his orthopedic doctor at [facility].    Prescribed:  Naprosyn Percocet #20  The patient appears reasonably screened and/or stabilized for discharge and I doubt any other medical condition or other [facility] requiring further screening, evaluation, or treatment in the ED at this time prior to discharge.      Tecla Mailloux L. Trisha Mangle, PA-C 12/24/12 4540

## 2012-12-23 NOTE — ED Notes (Signed)
Complain of pani in right knee. Previous injury from [facility]

## 2012-12-24 NOTE — ED Provider Notes (Signed)
Medical screening examination/treatment/procedure(s) were performed by non-physician practitioner and as supervising physician I was immediately available for consultation/collaboration.  Donnetta Hutching, MD 12/24/12 [phone]

## 2013-03-08 ENCOUNTER — Encounter (HOSPITAL_COMMUNITY): Payer: Self-pay | Admitting: *Deleted

## 2013-03-08 ENCOUNTER — Emergency Department (HOSPITAL_COMMUNITY)
Admission: EM | Admit: 2013-03-08 | Discharge: 2013-03-08 | Disposition: A | Discharge status: Home / Self Care | Payer: Self-pay | Attending: Emergency Medicine | Admitting: Emergency Medicine

## 2013-03-08 DIAGNOSIS — X500XXA Overexertion from strenuous movement or load, initial encounter: Secondary | ICD-10-CM | POA: Insufficient documentation

## 2013-03-08 DIAGNOSIS — Z79899 Other long term (current) drug therapy: Secondary | ICD-10-CM | POA: Insufficient documentation

## 2013-03-08 DIAGNOSIS — I10 Essential (primary) hypertension: Secondary | ICD-10-CM | POA: Insufficient documentation

## 2013-03-08 DIAGNOSIS — S335XXA Sprain of ligaments of lumbar spine, initial encounter: Secondary | ICD-10-CM | POA: Insufficient documentation

## 2013-03-08 DIAGNOSIS — Z8781 Personal history of (healed) traumatic fracture: Secondary | ICD-10-CM | POA: Insufficient documentation

## 2013-03-08 DIAGNOSIS — S39012A Strain of muscle, fascia and tendon of lower back, initial encounter: Secondary | ICD-10-CM

## 2013-03-08 DIAGNOSIS — Y929 Unspecified place or not applicable: Secondary | ICD-10-CM | POA: Insufficient documentation

## 2013-03-08 DIAGNOSIS — Y9389 Activity, other specified: Secondary | ICD-10-CM | POA: Insufficient documentation

## 2013-03-08 MED ORDER — HYDROCODONE-ACETAMINOPHEN 5-325 MG PO TABS: 1.0000 | ORAL_TABLET | ORAL | Status: DC | PRN

## 2013-03-08 MED ORDER — KETOROLAC TROMETHAMINE 60 MG/2ML IM SOLN
60.0000 mg | Freq: Once | INTRAMUSCULAR | Status: AC
  Administered 2013-03-08: 60 mg via INTRAMUSCULAR
  Filled 2013-03-08: qty 2

## 2013-03-08 NOTE — ED Notes (Signed)
Low back pain, onset when lifted a tire.

## 2013-03-08 NOTE — ED Notes (Signed)
Left arm blood pressure is 194/122 and Right arm pt blood pressure is 166/125

## 2013-03-08 NOTE — ED Provider Notes (Signed)
   History    CSN: [id number] Arrival date & time 03/08/13  1532  First MD Initiated Contact with Patient 03/08/13 1617     Chief Complaint  Patient presents with  . Back Pain   (Consider location/radiation/quality/duration/timing/severity/associated sxs/prior Treatment) Patient is a 42 y.o. male presenting with back pain. The history is provided by the patient.  Back Pain Associated symptoms: no chest pain    patient has acute on chronic left lower back pain. States he was lifting a tire on Friday and began to feel a tearing pain down his left back into his leg. This is his typical back pain. He states he only takes what he is given for a period he states that back in March she was told to see a doctor but has not been able to get around to get more medications. No numbness or weakness. No dysuria. No abdominal pain.  Past Medical History  Diagnosis Date  . Closed femur fracture   . Hypertension    Past Surgical History  Procedure Laterality Date  . Femur fracture surgery     History reviewed. No pertinent family history. History  Substance Use Topics  . Smoking status: Never Smoker   . Smokeless tobacco: Not on file  . Alcohol Use: No    Review of Systems  Cardiovascular: Negative for chest pain.  Genitourinary: Negative for frequency, flank pain and penile pain.  Musculoskeletal: Positive for back pain. Negative for gait problem.  Skin: Negative for pallor and rash.  Neurological: Negative for speech difficulty.    Allergies  Darvocet; Ultracet; and Ultram  Home Medications   Current Outpatient Rx  Name  Route  Sig  Dispense  Refill  . aspirin-acetaminophen-caffeine (EXCEDRIN EXTRA STRENGTH) 250-250-65 MG per tablet   Oral   Take 2 tablets by mouth every 6 (six) hours as needed for pain.         Marland Kitchen lisinopril-hydrochlorothiazide (PRINZIDE,ZESTORETIC) 20-25 MG per tablet   Oral   Take 1 tablet by mouth every morning.         Marland Kitchen acetaminophen (TYLENOL) 500  MG tablet   Oral   Take 1,000 mg by mouth every 6 (six) hours as needed for pain.         Marland Kitchen HYDROcodone-acetaminophen (NORCO/VICODIN) 5-325 MG per tablet   Oral   Take 1-2 tablets by mouth every 4 (four) hours as needed for pain.   10 tablet   0    BP 177/119  Pulse 63  Temp(Src) 97 F (36.1 C) (Oral)  Resp 18  Ht 5\' 7"  (1.702 m)  Wt 192 lb (87.091 kg)  BMI 30.06 kg/m2  SpO2 97% Physical Exam  Constitutional: He appears well-developed.  Cardiovascular: Normal rate and regular rhythm.   Pulmonary/Chest: Effort normal and breath sounds normal.  Abdominal: There is no tenderness.  Musculoskeletal: He exhibits tenderness.  Mild left lower back pain. Able to do good straight leg raise bilaterally. Neurovascular intact distally. No rash.    ED Course  Procedures (including critical care time) Labs Reviewed - No data to display No results found. 1. Lumbar strain, initial encounter   2. Hypertension     MDM  Patient with lumbar strain. History of same. No imaging needed. Patient will be given some pain medicines. He'll be discharged home. He was instructed in the ER it was not able to manage his chronic pain  Harrold Donath R. Rubin Payor, MD 03/08/13 2129

## 2013-06-19 DIAGNOSIS — I10 Essential (primary) hypertension: Secondary | ICD-10-CM

## 2013-06-19 DIAGNOSIS — S32401A Unspecified fracture of right acetabulum, initial encounter for closed fracture: Secondary | ICD-10-CM

## 2013-06-19 DIAGNOSIS — D62 Acute posthemorrhagic anemia: Secondary | ICD-10-CM

## 2013-06-20 ENCOUNTER — Encounter (HOSPITAL_COMMUNITY): Admission: EM | Disposition: A | Discharge status: Home Health | Payer: Self-pay | Source: Home / Self Care

## 2013-06-20 ENCOUNTER — Encounter (HOSPITAL_COMMUNITY): Payer: Self-pay | Admitting: Anesthesiology

## 2013-06-20 ENCOUNTER — Inpatient Hospital Stay (HOSPITAL_COMMUNITY)
Admission: EM | Admit: 2013-06-20 | Discharge: 2013-06-29 | DRG: 464 | Disposition: A | Discharge status: Home Health | Payer: Medicaid Other | Attending: General Surgery | Admitting: General Surgery

## 2013-06-20 ENCOUNTER — Emergency Department (HOSPITAL_COMMUNITY): Payer: Medicaid Other

## 2013-06-20 ENCOUNTER — Inpatient Hospital Stay (HOSPITAL_COMMUNITY): Payer: Medicaid Other

## 2013-06-20 ENCOUNTER — Inpatient Hospital Stay (HOSPITAL_COMMUNITY): Payer: Medicaid Other | Admitting: Anesthesiology

## 2013-06-20 ENCOUNTER — Encounter (HOSPITAL_COMMUNITY): Payer: Self-pay | Admitting: Emergency Medicine

## 2013-06-20 DIAGNOSIS — S32509A Unspecified fracture of unspecified pubis, initial encounter for closed fracture: Secondary | ICD-10-CM | POA: Diagnosis present

## 2013-06-20 DIAGNOSIS — I10 Essential (primary) hypertension: Secondary | ICD-10-CM

## 2013-06-20 DIAGNOSIS — D62 Acute posthemorrhagic anemia: Secondary | ICD-10-CM | POA: Diagnosis not present

## 2013-06-20 DIAGNOSIS — S32409A Unspecified fracture of unspecified acetabulum, initial encounter for closed fracture: Secondary | ICD-10-CM | POA: Diagnosis present

## 2013-06-20 DIAGNOSIS — Z23 Encounter for immunization: Secondary | ICD-10-CM

## 2013-06-20 DIAGNOSIS — E876 Hypokalemia: Secondary | ICD-10-CM | POA: Diagnosis not present

## 2013-06-20 DIAGNOSIS — E871 Hypo-osmolality and hyponatremia: Secondary | ICD-10-CM | POA: Diagnosis present

## 2013-06-20 DIAGNOSIS — K929 Disease of digestive system, unspecified: Secondary | ICD-10-CM | POA: Diagnosis not present

## 2013-06-20 DIAGNOSIS — K560 Paralytic ileus: Secondary | ICD-10-CM | POA: Diagnosis not present

## 2013-06-20 DIAGNOSIS — Z79899 Other long term (current) drug therapy: Secondary | ICD-10-CM

## 2013-06-20 DIAGNOSIS — S32009A Unspecified fracture of unspecified lumbar vertebra, initial encounter for closed fracture: Secondary | ICD-10-CM | POA: Diagnosis present

## 2013-06-20 DIAGNOSIS — S81009A Unspecified open wound, unspecified knee, initial encounter: Secondary | ICD-10-CM | POA: Diagnosis present

## 2013-06-20 DIAGNOSIS — Y831 Surgical operation with implant of artificial internal device as the cause of abnormal reaction of the patient, or of later complication, without mention of misadventure at the time of the procedure: Secondary | ICD-10-CM | POA: Diagnosis not present

## 2013-06-20 DIAGNOSIS — S32401A Unspecified fracture of right acetabulum, initial encounter for closed fracture: Secondary | ICD-10-CM

## 2013-06-20 DIAGNOSIS — M25559 Pain in unspecified hip: Secondary | ICD-10-CM | POA: Diagnosis present

## 2013-06-20 HISTORY — PX: PERCUTANEOUS PINNING: SHX2209

## 2013-06-20 LAB — URINE MICROSCOPIC-ADD ON

## 2013-06-20 LAB — CBC
HCT: 35.5 % — ABNORMAL LOW (ref 39.0–52.0)
Hemoglobin: 14.7 g/dL (ref 13.0–17.0)
Hemoglobin: 12.9 g/dL — ABNORMAL LOW (ref 13.0–17.0)
MCH: 32.5 pg (ref 26.0–34.0)
MCHC: 36.3 g/dL — ABNORMAL HIGH (ref 30.0–36.0)
MCV: 89.4 fL (ref 78.0–100.0)
Platelets: 201 10*3/uL (ref 150–400)
RBC: 4.54 MIL/uL (ref 4.22–5.81)
WBC: 16 10*3/uL — ABNORMAL HIGH (ref 4.0–10.5)

## 2013-06-20 LAB — URINALYSIS, ROUTINE W REFLEX MICROSCOPIC
Bilirubin Urine: NEGATIVE
Glucose, UA: NEGATIVE mg/dL
Ketones, ur: NEGATIVE mg/dL
Nitrite: NEGATIVE
Protein, ur: 100 mg/dL — AB

## 2013-06-20 LAB — BASIC METABOLIC PANEL
CO2: 24 mEq/L (ref 19–32)
Calcium: 8.6 mg/dL (ref 8.4–10.5)
Chloride: 94 mEq/L — ABNORMAL LOW (ref 96–112)
Potassium: 3.5 mEq/L (ref 3.5–5.1)
Sodium: 131 mEq/L — ABNORMAL LOW (ref 135–145)

## 2013-06-20 LAB — MRSA PCR SCREENING: MRSA by PCR: NEGATIVE

## 2013-06-20 LAB — ETHANOL: Alcohol, Ethyl (B): 266 mg/dL — ABNORMAL HIGH (ref 0–11)

## 2013-06-20 SURGERY — PINNING, EXTREMITY, PERCUTANEOUS
Anesthesia: General | Laterality: Right | Wound class: Clean

## 2013-06-20 SURGERY — CANCELLED PROCEDURE
Laterality: Right

## 2013-06-20 MED ORDER — LISINOPRIL-HYDROCHLOROTHIAZIDE 20-25 MG PO TABS: 1.0000 | ORAL_TABLET | Freq: Every morning | ORAL | Status: DC

## 2013-06-20 MED ORDER — HYDROMORPHONE HCL PF 1 MG/ML IJ SOLN
1.0000 mg | Freq: Once | INTRAMUSCULAR | Status: AC
  Administered 2013-06-20: 1 mg via INTRAVENOUS

## 2013-06-20 MED ORDER — OXYCODONE HCL 5 MG PO TABS: 5.0000 mg | ORAL_TABLET | Freq: Once | ORAL | Status: DC | PRN

## 2013-06-20 MED ORDER — LIDOCAINE HCL (CARDIAC) 20 MG/ML IV SOLN
INTRAVENOUS | Status: DC | PRN
  Administered 2013-06-20: 50 mg via INTRAVENOUS

## 2013-06-20 MED ORDER — SODIUM CHLORIDE 0.9 % IV SOLN
INTRAVENOUS | Status: DC
  Administered 2013-06-21: 1000 mL via INTRAVENOUS

## 2013-06-20 MED ORDER — PANTOPRAZOLE SODIUM 40 MG PO TBEC
40.0000 mg | DELAYED_RELEASE_TABLET | Freq: Every day | ORAL | Status: DC
  Administered 2013-06-20 – 2013-06-21 (×2): 40 mg via ORAL
  Filled 2013-06-20 (×2): qty 1

## 2013-06-20 MED ORDER — METOCLOPRAMIDE HCL 5 MG/ML IJ SOLN
5.0000 mg | Freq: Three times a day (TID) | INTRAMUSCULAR | Status: DC | PRN
  Filled 2013-06-20: qty 2

## 2013-06-20 MED ORDER — ACETAMINOPHEN 325 MG PO TABS: 650.0000 mg | ORAL_TABLET | ORAL | Status: DC | PRN

## 2013-06-20 MED ORDER — PROPOFOL 10 MG/ML IV BOLUS
INTRAVENOUS | Status: DC | PRN
  Administered 2013-06-20: 200 mg via INTRAVENOUS

## 2013-06-20 MED ORDER — HYDROMORPHONE HCL PF 1 MG/ML IJ SOLN
1.0000 mg | Freq: Once | INTRAMUSCULAR | Status: AC
  Administered 2013-06-20: 1 mg via INTRAVENOUS
  Filled 2013-06-20: qty 1

## 2013-06-20 MED ORDER — HYDROMORPHONE HCL PF 1 MG/ML IJ SOLN
INTRAMUSCULAR | Status: AC
  Filled 2013-06-20: qty 1

## 2013-06-20 MED ORDER — METOCLOPRAMIDE HCL 5 MG/ML IJ SOLN
INTRAMUSCULAR | Status: DC | PRN
  Administered 2013-06-20: 10 mg via INTRAVENOUS

## 2013-06-20 MED ORDER — INFLUENZA VAC SPLIT QUAD 0.5 ML IM SUSP
0.5000 mL | INTRAMUSCULAR | Status: AC
  Administered 2013-06-21: 0.5 mL via INTRAMUSCULAR
  Filled 2013-06-20: qty 0.5

## 2013-06-20 MED ORDER — PANTOPRAZOLE SODIUM 40 MG IV SOLR
40.0000 mg | Freq: Every day | INTRAVENOUS | Status: DC
  Administered 2013-06-23: 40 mg via INTRAVENOUS
  Filled 2013-06-20 (×5): qty 40

## 2013-06-20 MED ORDER — 0.9 % SODIUM CHLORIDE (POUR BTL) OPTIME
TOPICAL | Status: DC | PRN
  Administered 2013-06-20: 1000 mL

## 2013-06-20 MED ORDER — POTASSIUM CHLORIDE IN NACL 20-0.9 MEQ/L-% IV SOLN
INTRAVENOUS | Status: AC
  Administered 2013-06-20: 1000 mL via INTRAVENOUS
  Administered 2013-06-21: 05:00:00 via INTRAVENOUS
  Filled 2013-06-20 (×4): qty 1000

## 2013-06-20 MED ORDER — BISACODYL 10 MG RE SUPP: 10.0000 mg | Freq: Every day | RECTAL | Status: DC | PRN

## 2013-06-20 MED ORDER — DOCUSATE SODIUM 100 MG PO CAPS: 100.0000 mg | ORAL_CAPSULE | Freq: Two times a day (BID) | ORAL | Status: DC

## 2013-06-20 MED ORDER — DOCUSATE SODIUM 100 MG PO CAPS
100.0000 mg | ORAL_CAPSULE | Freq: Two times a day (BID) | ORAL | Status: DC
  Administered 2013-06-20 – 2013-06-21 (×2): 100 mg via ORAL
  Filled 2013-06-20 (×6): qty 1

## 2013-06-20 MED ORDER — METOCLOPRAMIDE HCL 5 MG PO TABS
5.0000 mg | ORAL_TABLET | Freq: Three times a day (TID) | ORAL | Status: DC | PRN
  Filled 2013-06-20: qty 2

## 2013-06-20 MED ORDER — CEFAZOLIN SODIUM-DEXTROSE 2-3 GM-% IV SOLR
INTRAVENOUS | Status: AC
  Filled 2013-06-20: qty 50

## 2013-06-20 MED ORDER — ONDANSETRON HCL 4 MG/2ML IJ SOLN
4.0000 mg | Freq: Four times a day (QID) | INTRAMUSCULAR | Status: DC | PRN
  Administered 2013-06-22: 4 mg via INTRAVENOUS
  Filled 2013-06-20 (×2): qty 2

## 2013-06-20 MED ORDER — ONDANSETRON HCL 4 MG PO TABS: 4.0000 mg | ORAL_TABLET | Freq: Four times a day (QID) | ORAL | Status: DC | PRN

## 2013-06-20 MED ORDER — HYDROMORPHONE HCL PF 1 MG/ML IJ SOLN
INTRAMUSCULAR | Status: AC
  Administered 2013-06-20: 0.5 mg via INTRAVENOUS
  Filled 2013-06-20: qty 1

## 2013-06-20 MED ORDER — HYDROCHLOROTHIAZIDE 25 MG PO TABS
25.0000 mg | ORAL_TABLET | Freq: Every day | ORAL | Status: DC
  Administered 2013-06-20 – 2013-06-27 (×6): 25 mg via ORAL
  Filled 2013-06-20 (×9): qty 1

## 2013-06-20 MED ORDER — SODIUM CHLORIDE 0.9 % IV BOLUS (SEPSIS)
250.0000 mL | Freq: Once | INTRAVENOUS | Status: AC
  Administered 2013-06-20: 250 mL via INTRAVENOUS

## 2013-06-20 MED ORDER — SODIUM CHLORIDE 0.9 % IV SOLN: INTRAVENOUS | Status: DC

## 2013-06-20 MED ORDER — ONDANSETRON HCL 4 MG/2ML IJ SOLN
INTRAMUSCULAR | Status: DC | PRN
  Administered 2013-06-20: 4 mg via INTRAVENOUS

## 2013-06-20 MED ORDER — ONDANSETRON HCL 4 MG/2ML IJ SOLN: 4.0000 mg | Freq: Four times a day (QID) | INTRAMUSCULAR | Status: DC | PRN

## 2013-06-20 MED ORDER — MUPIROCIN CALCIUM 2 % EX CREA
TOPICAL_CREAM | CUTANEOUS | Status: AC
  Filled 2013-06-20: qty 15

## 2013-06-20 MED ORDER — FENTANYL CITRATE 0.05 MG/ML IJ SOLN
INTRAMUSCULAR | Status: DC | PRN
  Administered 2013-06-20: 100 ug via INTRAVENOUS
  Administered 2013-06-20: 50 ug via INTRAVENOUS

## 2013-06-20 MED ORDER — CEFAZOLIN SODIUM-DEXTROSE 2-3 GM-% IV SOLR
INTRAVENOUS | Status: DC | PRN
  Administered 2013-06-20: 2 g via INTRAVENOUS

## 2013-06-20 MED ORDER — BACITRACIN-NEOMYCIN-POLYMYXIN 400-5-5000 EX OINT
TOPICAL_OINTMENT | Freq: Once | CUTANEOUS | Status: AC
  Administered 2013-06-20: 1 via TOPICAL
  Filled 2013-06-20: qty 1

## 2013-06-20 MED ORDER — LISINOPRIL 20 MG PO TABS
20.0000 mg | ORAL_TABLET | Freq: Every day | ORAL | Status: DC
  Administered 2013-06-20 – 2013-06-29 (×8): 20 mg via ORAL
  Filled 2013-06-20 (×10): qty 1

## 2013-06-20 MED ORDER — METHOCARBAMOL 100 MG/ML IJ SOLN
500.0000 mg | Freq: Four times a day (QID) | INTRAVENOUS | Status: DC | PRN
  Filled 2013-06-20: qty 5

## 2013-06-20 MED ORDER — HYDROMORPHONE HCL PF 1 MG/ML IJ SOLN
0.5000 mg | INTRAMUSCULAR | Status: DC | PRN
  Administered 2013-06-20 – 2013-06-25 (×22): 1 mg via INTRAVENOUS
  Filled 2013-06-20 (×23): qty 1

## 2013-06-20 MED ORDER — METOCLOPRAMIDE HCL 5 MG/ML IJ SOLN: 10.0000 mg | Freq: Once | INTRAMUSCULAR | Status: DC | PRN

## 2013-06-20 MED ORDER — ONDANSETRON HCL 4 MG/2ML IJ SOLN
4.0000 mg | Freq: Once | INTRAMUSCULAR | Status: AC
  Administered 2013-06-20: 4 mg via INTRAVENOUS
  Filled 2013-06-20: qty 2

## 2013-06-20 MED ORDER — SUCCINYLCHOLINE CHLORIDE 20 MG/ML IJ SOLN
INTRAMUSCULAR | Status: DC | PRN
  Administered 2013-06-20: 140 mg via INTRAVENOUS

## 2013-06-20 MED ORDER — IOHEXOL 300 MG/ML  SOLN
100.0000 mL | Freq: Once | INTRAMUSCULAR | Status: AC | PRN
  Administered 2013-06-20: 100 mL via INTRAVENOUS

## 2013-06-20 MED ORDER — MUPIROCIN CALCIUM 2 % EX CREA
TOPICAL_CREAM | CUTANEOUS | Status: DC | PRN
  Administered 2013-06-20: 1 via TOPICAL

## 2013-06-20 MED ORDER — OXYCODONE HCL 5 MG/5ML PO SOLN: 5.0000 mg | Freq: Once | ORAL | Status: DC | PRN

## 2013-06-20 MED ORDER — HYDROMORPHONE HCL PF 1 MG/ML IJ SOLN
0.2500 mg | INTRAMUSCULAR | Status: DC | PRN
  Administered 2013-06-20 (×4): 0.5 mg via INTRAVENOUS

## 2013-06-20 MED ORDER — OXYCODONE HCL 5 MG PO TABS
5.0000 mg | ORAL_TABLET | ORAL | Status: DC | PRN
Start: 2013-06-20 — End: 2013-06-25
  Administered 2013-06-20 – 2013-06-25 (×12): 10 mg via ORAL
  Filled 2013-06-20 (×11): qty 2

## 2013-06-20 MED ORDER — METHOCARBAMOL 500 MG PO TABS
500.0000 mg | ORAL_TABLET | Freq: Four times a day (QID) | ORAL | Status: DC | PRN
  Filled 2013-06-20: qty 1

## 2013-06-20 MED ORDER — LACTATED RINGERS IV SOLN
INTRAVENOUS | Status: DC | PRN
  Administered 2013-06-20 (×2): via INTRAVENOUS

## 2013-06-20 SURGICAL SUPPLY — 32 items
BANDAGE ELASTIC 3 VELCRO ST LF (GAUZE/BANDAGES/DRESSINGS) ×3 IMPLANT
BANDAGE ELASTIC 4 VELCRO ST LF (GAUZE/BANDAGES/DRESSINGS) IMPLANT
BANDAGE GAUZE ELAST BULKY 4 IN (GAUZE/BANDAGES/DRESSINGS) ×3 IMPLANT
BENZOIN TINCTURE PRP APPL 2/3 (GAUZE/BANDAGES/DRESSINGS) IMPLANT
BLADE SURG ROTATE 9660 (MISCELLANEOUS) IMPLANT
CLOTH BEACON ORANGE TIMEOUT ST (SAFETY) ×3 IMPLANT
COVER SURGICAL LIGHT HANDLE (MISCELLANEOUS) ×3 IMPLANT
CUFF TOURNIQUET SINGLE 18IN (TOURNIQUET CUFF) IMPLANT
CUFF TOURNIQUET SINGLE 24IN (TOURNIQUET CUFF) IMPLANT
DRSG EMULSION OIL 3X3 NADH (GAUZE/BANDAGES/DRESSINGS) IMPLANT
DURAPREP 26ML APPLICATOR (WOUND CARE) ×3 IMPLANT
GAUZE XEROFORM 1X8 LF (GAUZE/BANDAGES/DRESSINGS) IMPLANT
GLOVE BIOGEL PI IND STRL 8 (GLOVE) ×2 IMPLANT
GLOVE BIOGEL PI INDICATOR 8 (GLOVE) ×1
GLOVE SURG ORTHO 8.0 STRL STRW (GLOVE) ×3 IMPLANT
GOWN PREVENTION PLUS LG XLONG (DISPOSABLE) IMPLANT
GOWN STRL NON-REIN LRG LVL3 (GOWN DISPOSABLE) ×6 IMPLANT
KIT BASIN OR (CUSTOM PROCEDURE TRAY) ×3 IMPLANT
KIT ROOM TURNOVER OR (KITS) ×3 IMPLANT
MANIFOLD NEPTUNE II (INSTRUMENTS) ×3 IMPLANT
NS IRRIG 1000ML POUR BTL (IV SOLUTION) ×3 IMPLANT
PACK ORTHO EXTREMITY (CUSTOM PROCEDURE TRAY) ×3 IMPLANT
PAD ARMBOARD 7.5X6 YLW CONV (MISCELLANEOUS) ×6 IMPLANT
SPONGE GAUZE 4X4 12PLY (GAUZE/BANDAGES/DRESSINGS) IMPLANT
STRIP CLOSURE SKIN 1/2X4 (GAUZE/BANDAGES/DRESSINGS) IMPLANT
SUT ETHILON 4 0 P 3 18 (SUTURE) IMPLANT
SUT ETHILON 5 0 P 3 18 (SUTURE)
SUT NYLON ETHILON 5-0 P-3 1X18 (SUTURE) IMPLANT
SUT PROLENE 4 0 P 3 18 (SUTURE) IMPLANT
TOWEL OR 17X24 6PK STRL BLUE (TOWEL DISPOSABLE) ×3 IMPLANT
TOWEL OR 17X26 10 PK STRL BLUE (TOWEL DISPOSABLE) ×3 IMPLANT
WATER STERILE IRR 1000ML POUR (IV SOLUTION) ×3 IMPLANT

## 2013-06-20 SURGICAL SUPPLY — 33 items
BANDAGE ELASTIC 3 VELCRO ST LF (GAUZE/BANDAGES/DRESSINGS) ×2 IMPLANT
BANDAGE ELASTIC 4 VELCRO ST LF (GAUZE/BANDAGES/DRESSINGS) IMPLANT
BANDAGE GAUZE ELAST BULKY 4 IN (GAUZE/BANDAGES/DRESSINGS) ×2 IMPLANT
BENZOIN TINCTURE PRP APPL 2/3 (GAUZE/BANDAGES/DRESSINGS) IMPLANT
BLADE SURG ROTATE 9660 (MISCELLANEOUS) ×2 IMPLANT
CLOTH BEACON ORANGE TIMEOUT ST (SAFETY) ×2 IMPLANT
COVER SURGICAL LIGHT HANDLE (MISCELLANEOUS) ×2 IMPLANT
CUFF TOURNIQUET SINGLE 18IN (TOURNIQUET CUFF) IMPLANT
CUFF TOURNIQUET SINGLE 24IN (TOURNIQUET CUFF) IMPLANT
DRSG EMULSION OIL 3X3 NADH (GAUZE/BANDAGES/DRESSINGS) IMPLANT
DRSG MEPILEX BORDER 4X4 (GAUZE/BANDAGES/DRESSINGS) ×2 IMPLANT
DURAPREP 26ML APPLICATOR (WOUND CARE) IMPLANT
GAUZE XEROFORM 1X8 LF (GAUZE/BANDAGES/DRESSINGS) ×2 IMPLANT
GLOVE BIOGEL PI IND STRL 8 (GLOVE) ×1 IMPLANT
GLOVE BIOGEL PI INDICATOR 8 (GLOVE) ×1
GLOVE SURG ORTHO 8.0 STRL STRW (GLOVE) ×2 IMPLANT
GOWN PREVENTION PLUS LG XLONG (DISPOSABLE) IMPLANT
GOWN STRL NON-REIN LRG LVL3 (GOWN DISPOSABLE) ×4 IMPLANT
KIT BASIN OR (CUSTOM PROCEDURE TRAY) ×2 IMPLANT
KIT ROOM TURNOVER OR (KITS) ×2 IMPLANT
MANIFOLD NEPTUNE II (INSTRUMENTS) IMPLANT
NS IRRIG 1000ML POUR BTL (IV SOLUTION) ×2 IMPLANT
PACK ORTHO EXTREMITY (CUSTOM PROCEDURE TRAY) ×2 IMPLANT
PAD ARMBOARD 7.5X6 YLW CONV (MISCELLANEOUS) ×4 IMPLANT
SPONGE GAUZE 4X4 12PLY (GAUZE/BANDAGES/DRESSINGS) ×2 IMPLANT
STRIP CLOSURE SKIN 1/2X4 (GAUZE/BANDAGES/DRESSINGS) IMPLANT
SUT ETHILON 4 0 P 3 18 (SUTURE) ×2 IMPLANT
SUT ETHILON 5 0 P 3 18 (SUTURE)
SUT NYLON ETHILON 5-0 P-3 1X18 (SUTURE) IMPLANT
SUT PROLENE 4 0 P 3 18 (SUTURE) IMPLANT
TOWEL OR 17X24 6PK STRL BLUE (TOWEL DISPOSABLE) ×2 IMPLANT
TOWEL OR 17X26 10 PK STRL BLUE (TOWEL DISPOSABLE) ×2 IMPLANT
WATER STERILE IRR 1000ML POUR (IV SOLUTION) IMPLANT

## 2013-06-20 NOTE — Progress Notes (Signed)
Orthopedic Tech Progress Note Patient Details:  Troy Hunt 12-May-1971 [id number]  Musculoskeletal Traction Type of Traction: Skeletal (Balanced Suspension) Traction Location: rt tibia Traction Weight: 20 lbs    Shawnie Pons 06/20/2013, 11:16 AM
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
## 2013-06-20 NOTE — Op Note (Signed)
NAME:  BENTZION, DAURIA NO.:  [id number]  MEDICAL RECORD NO.:  [id number]  LOCATION:  2C11C                        FACILITY:  [facility]  PHYSICIAN:  Burnard Bunting, M.D.    DATE OF BIRTH:  [birth year]  DATE OF PROCEDURE: DATE OF DISCHARGE:  06/20/2013                              OPERATIVE REPORT   PREOPERATIVE DIAGNOSIS:  Right comminuted acetabular fracture, dislocation with 4 cm laceration on the right knee.  POSTOPERATIVE DIAGNOSIS:  Right comminuted acetabular fracture, dislocation with 4 cm laceration on the right knee.  PROCEDURE:  Closed reduction and application traction pin to the right Tibia  with excisional debridement, closure of simple laceration measuring 4 cm.  SURGEON:  Burnard Bunting, M.D.  ASSISTANT:  None.  ANESTHESIA:  General.  INDICATIONS:  Troy Hunt is a patient with comminuted acetabular fracture, presents for operative management after explanation of risks and benefits.  He also has a laceration on the knee which requires excisional debridement and closure.  PROCEDURE IN DETAIL:  The patient was brought into the operating room, where general endotracheal anesthesia was induced.  Preoperative antibiotics were administered.  Time-out was called.  Right leg was prescrubbed with alcohol and prepped with Hibiclens and saline.  The patient did have a laceration which was L-shaped with thinning and additional limb which was unlikely bolt shaped with 3 limbs.  This was a superficial laceration.  There were necrotic skin edges.  Skin and subcutaneous tissue were then debrided and irrigated with irrigating solution and closed using 3-0 nylon sutures.  Under fluoroscopic guidance, a pin was placed just proximal to the tibial tubercle within the tibial plateau.  The traction bow was placed and balanced skeletal traction 20 pounds were applied.  Bactroban cream was used to cover the entry and exit site of the tibial pin and a laceration.   The patient was then extubated and transferred to recovery room.  He will be noted that the 1 applying the traction and then under fluoroscopic guidance, the protrusion of the head was actually pulled with traction to more anatomic location out of the pelvis.  The patient tolerated the procedure well without immediate complications.  Transferred to recovery room in stable condition.  Plan is for operative fixation next week.     Burnard Bunting, M.D.     GSD/MEDQ  D:  06/20/2013  T:  06/20/2013  Job:  [id number]

## 2013-06-20 NOTE — Anesthesia Preprocedure Evaluation (Signed)
Anesthesia Evaluation  Patient identified by MRN, date of birth, ID band Patient awake    Reviewed: Allergy & Precautions, H&P , NPO status , Patient's Chart, lab work & pertinent test results, reviewed documented beta blocker date and time   Airway Mallampati: II TM Distance: >3 FB Neck ROM: full    Dental   Pulmonary neg pulmonary ROS,  breath sounds clear to auscultation        Cardiovascular hypertension, negative cardio ROS  Rhythm:regular     Neuro/Psych negative neurological ROS  negative psych ROS   GI/Hepatic negative GI ROS, (+)     substance abuse  alcohol use,   Endo/Other  negative endocrine ROS  Renal/GU negative Renal ROS  negative genitourinary   Musculoskeletal   Abdominal   Peds  Hematology negative hematology ROS (+)   Anesthesia Other Findings See surgeon's H&P   Reproductive/Obstetrics negative OB ROS                           Anesthesia Physical Anesthesia Plan  ASA: III and emergent  Anesthesia Plan: General   Post-op Pain Management:    Induction: Intravenous, Rapid sequence and Cricoid pressure planned  Airway Management Planned: Oral ETT  Additional Equipment:   Intra-op Plan:   Post-operative Plan: Extubation in OR  Informed Consent: I have reviewed the patients History and Physical, chart, labs and discussed the procedure including the risks, benefits and alternatives for the proposed anesthesia with the patient or authorized representative who has indicated his/her understanding and acceptance.   Dental Advisory Given  Plan Discussed with: CRNA and Surgeon  Anesthesia Plan Comments:         Anesthesia Quick Evaluation

## 2013-06-20 NOTE — Anesthesia Procedure Notes (Signed)
Procedure Name: Intubation Date/Time: 06/20/2013 9:41 AM Performed by: Ferol Luz L Pre-anesthesia Checklist: Patient identified, Emergency Drugs available, Suction available, Patient being monitored and Timeout performed Patient Re-evaluated:Patient Re-evaluated prior to inductionOxygen Delivery Method: Circle system utilized Preoxygenation: Pre-oxygenation with 100% oxygen Intubation Type: IV induction, Rapid sequence and Cricoid Pressure applied Laryngoscope Size: Mac and 4 Grade View: Grade I Tube type: Oral Tube size: 7.5 mm Number of attempts: 1 Airway Equipment and Method: Stylet Placement Confirmation: ETT inserted through vocal cords under direct vision,  positive ETCO2 and breath sounds checked- equal and bilateral Secured at: 21 cm Tube secured with: Tape Dental Injury: Teeth and Oropharynx as per pre-operative assessment

## 2013-06-20 NOTE — Anesthesia Postprocedure Evaluation (Signed)
Anesthesia Post Note  Patient: Troy Hunt  Procedure(s) Performed: Procedure(s) (LRB): TRACTION PINNING TIBIA WITH I&D RIGHT KNEE (Right)  Anesthesia type: General  Patient location: PACU  Post pain: Pain level controlled  Post assessment: Patient's Cardiovascular Status Stable  Last Vitals:  Filed Vitals:   06/20/13 1100  BP: 147/96  Pulse:   Temp:   Resp:     Post vital signs: Reviewed and stable  Level of consciousness: alert  Complications: No apparent anesthesia complications

## 2013-06-20 NOTE — Transfer of Care (Signed)
Immediate Anesthesia Transfer of Care Note  Patient: Troy Hunt  Procedure(s) Performed: Procedure(s): TRACTION PINNING TIBIA WITH I&D RIGHT KNEE (Right)  Patient Location: PACU  Anesthesia Type:General  Level of Consciousness: awake, alert , oriented and patient cooperative  Airway & Oxygen Therapy: Patient Spontanous Breathing and Patient connected to nasal cannula oxygen  Post-op Assessment: Report given to PACU RN, Post -op Vital signs reviewed and stable and Patient moving all extremities  Post vital signs: Reviewed and stable  Complications: No apparent anesthesia complications
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
## 2013-06-20 NOTE — Brief Op Note (Signed)
06/20/2013  10:29 AM  PATIENT:  Troy Hunt  43 y.o. male  PRE-OPERATIVE DIAGNOSIS:  Comminuted right acetabular fracture  POST-OPERATIVE DIAGNOSIS:  same  PROCEDURE:  Procedure(s): TRACTION PINNING TIBIA WITH I&D RIGHT KNEE  SURGEON:  Surgeon(s): Cammy Copa, MD  ASSISTANT:   ANESTHESIA:   general  EBL: 1 ml    Total I/O In: 1000 [I.V.:1000] Out: 50 [Urine:50]  BLOOD ADMINISTERED: none  DRAINS: none   LOCAL MEDICATIONS USED:  none  SPECIMEN:  No Specimen  COUNTS:  YES  TOURNIQUET:  * No tourniquets in log *  DICTATION: .Other Dictation: Dictation Number [id number]  PLAN OF CARE: Admit to inpatient   PATIENT DISPOSITION:  PACU - hemodynamically stable

## 2013-06-20 NOTE — ED Notes (Addendum)
Family Contact Information:   Robben Jagiello (Spouse): [phone]
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
## 2013-06-21 ENCOUNTER — Inpatient Hospital Stay (HOSPITAL_COMMUNITY): Payer: Medicaid Other

## 2013-06-21 ENCOUNTER — Encounter (HOSPITAL_COMMUNITY): Payer: Self-pay | Admitting: *Deleted

## 2013-06-21 DIAGNOSIS — D62 Acute posthemorrhagic anemia: Secondary | ICD-10-CM

## 2013-06-21 LAB — CBC WITH DIFFERENTIAL/PLATELET
Basophils Relative: 0 % (ref 0–1)
Eosinophils Absolute: 0.2 10*3/uL (ref 0.0–0.7)
Eosinophils Relative: 1 % (ref 0–5)
Hemoglobin: 12.9 g/dL — ABNORMAL LOW (ref 13.0–17.0)
Lymphs Abs: 1.5 10*3/uL (ref 0.7–4.0)
MCH: 32.3 pg (ref 26.0–34.0)
MCHC: 36.2 g/dL — ABNORMAL HIGH (ref 30.0–36.0)
MCV: 89 fL (ref 78.0–100.0)
Monocytes Absolute: 1.4 10*3/uL — ABNORMAL HIGH (ref 0.1–1.0)
Monocytes Relative: 13 % — ABNORMAL HIGH (ref 3–12)
Neutro Abs: 7.3 10*3/uL (ref 1.7–7.7)
Neutrophils Relative %: 71 % (ref 43–77)

## 2013-06-21 LAB — CBC
HCT: 35.3 % — ABNORMAL LOW (ref 39.0–52.0)
MCH: 31.6 pg (ref 26.0–34.0)
MCHC: 35.1 g/dL (ref 30.0–36.0)
MCHC: 35.6 g/dL (ref 30.0–36.0)
MCV: 89.8 fL (ref 78.0–100.0)
MCV: 90.5 fL (ref 78.0–100.0)
Platelets: 135 10*3/uL — ABNORMAL LOW (ref 150–400)
Platelets: 143 10*3/uL — ABNORMAL LOW (ref 150–400)
RBC: 3.93 MIL/uL — ABNORMAL LOW (ref 4.22–5.81)
RDW: 12.2 % (ref 11.5–15.5)
RDW: 12.2 % (ref 11.5–15.5)
WBC: 11.8 10*3/uL — ABNORMAL HIGH (ref 4.0–10.5)

## 2013-06-21 LAB — BASIC METABOLIC PANEL
BUN: 9 mg/dL (ref 6–23)
Creatinine, Ser: 0.72 mg/dL (ref 0.50–1.35)
GFR calc Af Amer: >90 mL/min (ref >90)
GFR calc non Af Amer: >90 mL/min (ref >90)
Glucose, Bld: 122 mg/dL — ABNORMAL HIGH (ref 70–99)
Potassium: 3.7 mEq/L (ref 3.5–5.1)

## 2013-06-21 MED ORDER — ONDANSETRON HCL 4 MG/2ML IJ SOLN
4.0000 mg | Freq: Four times a day (QID) | INTRAMUSCULAR | Status: DC | PRN
  Filled 2013-06-21: qty 2

## 2013-06-21 MED ORDER — CEFAZOLIN SODIUM-DEXTROSE 2-3 GM-% IV SOLR
2.0000 g | INTRAVENOUS | Status: AC
  Administered 2013-06-22: 2 g via INTRAVENOUS
  Filled 2013-06-21: qty 50

## 2013-06-21 MED ORDER — NALOXONE HCL 0.4 MG/ML IJ SOLN: 0.4000 mg | INTRAMUSCULAR | Status: DC | PRN

## 2013-06-21 MED ORDER — METHOCARBAMOL 100 MG/ML IJ SOLN
500.0000 mg | Freq: Four times a day (QID) | INTRAVENOUS | Status: DC | PRN
  Filled 2013-06-21: qty 10

## 2013-06-21 MED ORDER — SODIUM CHLORIDE 0.9 % IJ SOLN: 9.0000 mL | INTRAMUSCULAR | Status: DC | PRN

## 2013-06-21 MED ORDER — DIPHENHYDRAMINE HCL 12.5 MG/5ML PO ELIX
12.5000 mg | ORAL_SOLUTION | Freq: Four times a day (QID) | ORAL | Status: DC | PRN
  Filled 2013-06-21: qty 5

## 2013-06-21 MED ORDER — METHOCARBAMOL 500 MG PO TABS
500.0000 mg | ORAL_TABLET | Freq: Four times a day (QID) | ORAL | Status: DC | PRN
  Administered 2013-06-25: 500 mg via ORAL
  Administered 2013-06-25 – 2013-06-28 (×9): 1000 mg via ORAL
  Administered 2013-06-28: 500 mg via ORAL
  Filled 2013-06-21 (×5): qty 2
  Filled 2013-06-21: qty 1
  Filled 2013-06-21 (×5): qty 2
  Filled 2013-06-21: qty 1

## 2013-06-21 MED ORDER — DIPHENHYDRAMINE HCL 50 MG/ML IJ SOLN
12.5000 mg | Freq: Four times a day (QID) | INTRAMUSCULAR | Status: DC | PRN
  Administered 2013-06-21: 12.5 mg via INTRAVENOUS
  Filled 2013-06-21: qty 1

## 2013-06-21 MED ORDER — HYDROMORPHONE 0.3 MG/ML IV SOLN
INTRAVENOUS | Status: DC
  Administered 2013-06-21 – 2013-06-22 (×2): via INTRAVENOUS
  Filled 2013-06-21: qty 25
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
## 2013-06-21 NOTE — Progress Notes (Signed)
UR completed.  Delvecchio Madole, RN BSN MHA CCM Trauma/Neuro ICU Case Manager [phone]  
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
## 2013-06-22 ENCOUNTER — Encounter (HOSPITAL_COMMUNITY): Payer: Self-pay | Admitting: Anesthesiology

## 2013-06-22 ENCOUNTER — Inpatient Hospital Stay (HOSPITAL_COMMUNITY): Payer: Medicaid Other

## 2013-06-22 ENCOUNTER — Encounter (HOSPITAL_COMMUNITY): Admission: EM | Disposition: A | Discharge status: Home Health | Payer: Self-pay | Source: Home / Self Care

## 2013-06-22 ENCOUNTER — Encounter (HOSPITAL_COMMUNITY): Payer: Self-pay | Admitting: Orthopedic Surgery

## 2013-06-22 ENCOUNTER — Inpatient Hospital Stay (HOSPITAL_COMMUNITY): Payer: Medicaid Other | Admitting: Anesthesiology

## 2013-06-22 HISTORY — PX: ORIF ACETABULAR FRACTURE: SHX5029

## 2013-06-22 LAB — POCT I-STAT 4, (NA,K, GLUC, HGB,HCT)
Glucose, Bld: 116 mg/dL — ABNORMAL HIGH (ref 70–99)
HCT: 30 % — ABNORMAL LOW (ref 39.0–52.0)
Potassium: 3.4 meq/L — ABNORMAL LOW (ref 3.5–5.1)

## 2013-06-22 LAB — PROTIME-INR
INR: 1.3 (ref 0.00–1.49)
Prothrombin Time: 15.9 s — ABNORMAL HIGH (ref 11.6–15.2)

## 2013-06-22 LAB — TYPE AND SCREEN: ABO/RH(D): O NEG

## 2013-06-22 LAB — ABO/RH: ABO/RH(D): O NEG

## 2013-06-22 SURGERY — OPEN REDUCTION INTERNAL FIXATION (ORIF) ACETABULAR FRACTURE
Anesthesia: General | Laterality: Right

## 2013-06-22 SURGERY — OPEN REDUCTION INTERNAL FIXATION (ORIF) ACETABULAR FRACTURE
Anesthesia: General | Site: Hip | Laterality: Right | Wound class: Clean

## 2013-06-22 MED ORDER — DIPHENHYDRAMINE HCL 25 MG PO CAPS
25.0000 mg | ORAL_CAPSULE | Freq: Four times a day (QID) | ORAL | Status: DC | PRN
  Administered 2013-06-22: 25 mg via ORAL
  Filled 2013-06-22: qty 1

## 2013-06-22 MED ORDER — DOCUSATE SODIUM 100 MG PO CAPS
100.0000 mg | ORAL_CAPSULE | Freq: Two times a day (BID) | ORAL | Status: DC
  Administered 2013-06-22 – 2013-06-23 (×2): 100 mg via ORAL
  Filled 2013-06-22 (×5): qty 1

## 2013-06-22 MED ORDER — HYDROMORPHONE HCL PF 1 MG/ML IJ SOLN: 0.2500 mg | INTRAMUSCULAR | Status: DC | PRN

## 2013-06-22 MED ORDER — POTASSIUM CHLORIDE IN NACL 20-0.9 MEQ/L-% IV SOLN: INTRAVENOUS | Status: DC

## 2013-06-22 MED ORDER — CEFAZOLIN SODIUM 1-5 GM-% IV SOLN
1.0000 g | Freq: Four times a day (QID) | INTRAVENOUS | Status: AC
  Administered 2013-06-22 – 2013-06-23 (×3): 1 g via INTRAVENOUS
  Filled 2013-06-22 (×3): qty 50

## 2013-06-22 MED ORDER — FENTANYL CITRATE 0.05 MG/ML IJ SOLN
INTRAMUSCULAR | Status: DC | PRN
  Administered 2013-06-22 (×2): 50 ug via INTRAVENOUS
  Administered 2013-06-22: 150 ug via INTRAVENOUS
  Administered 2013-06-22: 25 ug via INTRAVENOUS
  Administered 2013-06-22: 100 ug via INTRAVENOUS
  Administered 2013-06-22: 50 ug via INTRAVENOUS
  Administered 2013-06-22: 150 ug via INTRAVENOUS
  Administered 2013-06-22: 100 ug via INTRAVENOUS

## 2013-06-22 MED ORDER — EPHEDRINE SULFATE 50 MG/ML IJ SOLN
INTRAMUSCULAR | Status: DC | PRN
  Administered 2013-06-22: 10 mg via INTRAVENOUS

## 2013-06-22 MED ORDER — MIDAZOLAM HCL 5 MG/5ML IJ SOLN
INTRAMUSCULAR | Status: DC | PRN
  Administered 2013-06-22: 2 mg via INTRAVENOUS

## 2013-06-22 MED ORDER — LACTATED RINGERS IV SOLN
INTRAVENOUS | Status: DC | PRN
  Administered 2013-06-22 (×2): via INTRAVENOUS

## 2013-06-22 MED ORDER — ALBUMIN HUMAN 5 % IV SOLN
INTRAVENOUS | Status: DC | PRN
  Administered 2013-06-22 (×2): via INTRAVENOUS

## 2013-06-22 MED ORDER — GLYCOPYRROLATE 0.2 MG/ML IJ SOLN
INTRAMUSCULAR | Status: DC | PRN
  Administered 2013-06-22: 0.6 mg via INTRAVENOUS

## 2013-06-22 MED ORDER — METOCLOPRAMIDE HCL 5 MG PO TABS
5.0000 mg | ORAL_TABLET | Freq: Three times a day (TID) | ORAL | Status: DC | PRN
  Filled 2013-06-22: qty 2

## 2013-06-22 MED ORDER — HYDROMORPHONE 0.3 MG/ML IV SOLN
INTRAVENOUS | Status: AC
  Filled 2013-06-22: qty 25

## 2013-06-22 MED ORDER — PHENYLEPHRINE HCL 10 MG/ML IJ SOLN
INTRAMUSCULAR | Status: DC | PRN
  Administered 2013-06-22 (×5): 80 ug via INTRAVENOUS

## 2013-06-22 MED ORDER — ENOXAPARIN SODIUM 40 MG/0.4ML ~~LOC~~ SOLN
40.0000 mg | SUBCUTANEOUS | Status: DC
  Administered 2013-06-23 – 2013-06-25 (×3): 40 mg via SUBCUTANEOUS
  Filled 2013-06-22 (×6): qty 0.4

## 2013-06-22 MED ORDER — WARFARIN VIDEO
Freq: Once | Status: AC
  Administered 2013-06-22: 20:00:00

## 2013-06-22 MED ORDER — 0.9 % SODIUM CHLORIDE (POUR BTL) OPTIME
TOPICAL | Status: DC | PRN
  Administered 2013-06-22: 1000 mL

## 2013-06-22 MED ORDER — ONDANSETRON HCL 4 MG/2ML IJ SOLN
4.0000 mg | Freq: Four times a day (QID) | INTRAMUSCULAR | Status: DC | PRN
  Administered 2013-06-22 – 2013-06-25 (×4): 4 mg via INTRAVENOUS
  Filled 2013-06-22 (×4): qty 2

## 2013-06-22 MED ORDER — ARTIFICIAL TEARS OP OINT
TOPICAL_OINTMENT | OPHTHALMIC | Status: DC | PRN
  Administered 2013-06-22: 1 via OPHTHALMIC

## 2013-06-22 MED ORDER — PROMETHAZINE HCL 25 MG/ML IJ SOLN: 6.2500 mg | INTRAMUSCULAR | Status: DC | PRN

## 2013-06-22 MED ORDER — VECURONIUM BROMIDE 10 MG IV SOLR
INTRAVENOUS | Status: DC | PRN
  Administered 2013-06-22 (×3): 3 mg via INTRAVENOUS

## 2013-06-22 MED ORDER — ONDANSETRON HCL 4 MG PO TABS: 4.0000 mg | ORAL_TABLET | Freq: Four times a day (QID) | ORAL | Status: DC | PRN

## 2013-06-22 MED ORDER — ROCURONIUM BROMIDE 100 MG/10ML IV SOLN
INTRAVENOUS | Status: DC | PRN
  Administered 2013-06-22: 50 mg via INTRAVENOUS

## 2013-06-22 MED ORDER — WARFARIN SODIUM 7.5 MG PO TABS
7.5000 mg | ORAL_TABLET | Freq: Once | ORAL | Status: AC
  Administered 2013-06-22: 7.5 mg via ORAL
  Filled 2013-06-22: qty 1

## 2013-06-22 MED ORDER — WARFARIN - PHARMACIST DOSING INPATIENT
Freq: Every day | Status: DC
  Administered 2013-06-28: 18:00:00

## 2013-06-22 MED ORDER — LIDOCAINE HCL (CARDIAC) 20 MG/ML IV SOLN
INTRAVENOUS | Status: DC | PRN
  Administered 2013-06-22: 100 mg via INTRAVENOUS

## 2013-06-22 MED ORDER — ONDANSETRON HCL 4 MG/2ML IJ SOLN
INTRAMUSCULAR | Status: DC | PRN
  Administered 2013-06-22 (×2): 4 mg via INTRAMUSCULAR

## 2013-06-22 MED ORDER — SUCCINYLCHOLINE CHLORIDE 20 MG/ML IJ SOLN
INTRAMUSCULAR | Status: DC | PRN
  Administered 2013-06-22: 100 mg via INTRAVENOUS

## 2013-06-22 MED ORDER — COUMADIN BOOK
Freq: Once | Status: AC
  Administered 2013-06-22: 20:00:00
  Filled 2013-06-22: qty 1

## 2013-06-22 MED ORDER — NEOSTIGMINE METHYLSULFATE 1 MG/ML IJ SOLN
INTRAMUSCULAR | Status: DC | PRN
  Administered 2013-06-22: 4 mg via INTRAVENOUS

## 2013-06-22 MED ORDER — PROPOFOL 10 MG/ML IV BOLUS
INTRAVENOUS | Status: DC | PRN
  Administered 2013-06-22: 110 mg via INTRAVENOUS

## 2013-06-22 MED ORDER — METOCLOPRAMIDE HCL 5 MG/ML IJ SOLN
5.0000 mg | Freq: Three times a day (TID) | INTRAMUSCULAR | Status: DC | PRN
  Filled 2013-06-22: qty 2

## 2013-06-22 MED ORDER — SODIUM CHLORIDE 0.9 % IR SOLN
Status: DC | PRN
  Administered 2013-06-22: 3000 mL

## 2013-06-22 SURGICAL SUPPLY — 87 items
APPLIER CLIP 11 MED OPEN (CLIP)
BIT DRILL AO MATTA 2.5MX230M (BIT) ×1 IMPLANT
BIT DRILL SCALD PELVS II 2.5MM (BIT) ×1 IMPLANT
BLADE SURG ROTATE 9660 (MISCELLANEOUS) IMPLANT
BRUSH SCRUB DISP (MISCELLANEOUS) ×4 IMPLANT
CLIP APPLIE 11 MED OPEN (CLIP) IMPLANT
CLOTH BEACON ORANGE TIMEOUT ST (SAFETY) IMPLANT
COVER SURGICAL LIGHT HANDLE (MISCELLANEOUS) ×4 IMPLANT
DRAIN CHANNEL 10F 3/8 F FF (DRAIN) IMPLANT
DRAIN CHANNEL 15F RND FF W/TCR (WOUND CARE) IMPLANT
DRAPE C-ARM 42X72 X-RAY (DRAPES) ×2 IMPLANT
DRAPE C-ARMOR (DRAPES) ×2 IMPLANT
DRAPE INCISE IOBAN 66X45 STRL (DRAPES) IMPLANT
DRAPE INCISE IOBAN 85X60 (DRAPES) ×2 IMPLANT
DRAPE ORTHO SPLIT 77X108 STRL (DRAPES) ×2
DRAPE SURG ORHT 6 SPLT 77X108 (DRAPES) ×2 IMPLANT
DRAPE U-SHAPE 47X51 STRL (DRAPES) ×2 IMPLANT
DRILL BIT AO MATTA 2.5MX230M (BIT) ×2
DRILL SCALED PELVIS II 2.5MM (BIT) ×2
DRSG ADAPTIC 3X8 NADH LF (GAUZE/BANDAGES/DRESSINGS) IMPLANT
DRSG MEPILEX BORDER 4X12 (GAUZE/BANDAGES/DRESSINGS) ×2 IMPLANT
DRSG PAD ABDOMINAL 8X10 ST (GAUZE/BANDAGES/DRESSINGS) IMPLANT
ELECT BLADE 4.0 EZ CLEAN MEGAD (MISCELLANEOUS) ×2
ELECT BLADE 6.5 EXT (BLADE) IMPLANT
ELECT CAUTERY BLADE 6.4 (BLADE) ×2 IMPLANT
ELECT REM PT RETURN 9FT ADLT (ELECTROSURGICAL) ×2
ELECTRODE BLDE 4.0 EZ CLN MEGD (MISCELLANEOUS) ×1 IMPLANT
ELECTRODE REM PT RTRN 9FT ADLT (ELECTROSURGICAL) ×1 IMPLANT
EVACUATOR 1/8 PVC DRAIN (DRAIN) IMPLANT
EVACUATOR SILICONE 100CC (DRAIN) IMPLANT
GAUZE SPONGE 4X4 16PLY XRAY LF (GAUZE/BANDAGES/DRESSINGS) IMPLANT
GLOVE BIO SURGEON STRL SZ7.5 (GLOVE) ×2 IMPLANT
GLOVE BIO SURGEON STRL SZ8 (GLOVE) ×4 IMPLANT
GLOVE BIOGEL PI IND STRL 7.5 (GLOVE) ×1 IMPLANT
GLOVE BIOGEL PI IND STRL 8 (GLOVE) ×1 IMPLANT
GLOVE BIOGEL PI INDICATOR 7.5 (GLOVE) ×1
GLOVE BIOGEL PI INDICATOR 8 (GLOVE) ×1
GOWN PREVENTION PLUS XLARGE (GOWN DISPOSABLE) ×2 IMPLANT
GOWN PREVENTION PLUS XXLARGE (GOWN DISPOSABLE) ×2 IMPLANT
GOWN STRL NON-REIN LRG LVL3 (GOWN DISPOSABLE) ×4 IMPLANT
HANDPIECE INTERPULSE COAX TIP (DISPOSABLE) ×1
KIT BASIN OR (CUSTOM PROCEDURE TRAY) ×2 IMPLANT
KIT ROOM TURNOVER OR (KITS) ×2 IMPLANT
LIGHT ORTHO (MISCELLANEOUS) IMPLANT
LOOP VESSEL MAXI BLUE (MISCELLANEOUS) IMPLANT
MANIFOLD NEPTUNE II (INSTRUMENTS) IMPLANT
NEEDLE MAYO TROCAR (NEEDLE) IMPLANT
NS IRRIG 1000ML POUR BTL (IV SOLUTION) ×2 IMPLANT
PACK TOTAL JOINT (CUSTOM PROCEDURE TRAY) ×2 IMPLANT
PAD ARMBOARD 7.5X6 YLW CONV (MISCELLANEOUS) ×4 IMPLANT
PILLOW ABDUCTION HIP (SOFTGOODS) ×2 IMPLANT
PIN APEX 180X5XRDC SS (PIN) ×2 IMPLANT
PIN APEX 5X180 (PIN) ×2
PIPE LIGHT STORZ FITTING (MISCELLANEOUS) ×2 IMPLANT
PLATE ACET STRT 94.5M 8H (Plate) ×2 IMPLANT
PLATE PELV STRT 122.5M 8H (Plate) ×2 IMPLANT
RETRIEVER SUT HEWSON (MISCELLANEOUS) ×2 IMPLANT
SCREW CORTEX ST MATTA 3.5X12MM (Screw) ×2 IMPLANT
SCREW CORTEX ST MATTA 3.5X20 (Screw) ×4 IMPLANT
SCREW CORTEX ST MATTA 3.5X24 (Screw) ×4 IMPLANT
SCREW CORTEX ST MATTA 3.5X26MM (Screw) ×2 IMPLANT
SCREW CORTEX ST MATTA 3.5X28MM (Screw) ×4 IMPLANT
SCREW CORTEX ST MATTA 3.5X30MM (Screw) ×4 IMPLANT
SCREW CORTEX ST MATTA 3.5X34MM (Screw) ×2 IMPLANT
SCREW CORTEX ST MATTA 3.5X36MM (Screw) ×2 IMPLANT
SCREW CORTEX ST MATTA 3.5X40MM (Screw) ×2 IMPLANT
SCREW CORTICAL 3.5X42MM (Screw) ×2 IMPLANT
SET HNDPC FAN SPRY TIP SCT (DISPOSABLE) ×1 IMPLANT
SPONGE GAUZE 4X4 12PLY (GAUZE/BANDAGES/DRESSINGS) IMPLANT
SPONGE LAP 18X18 X RAY DECT (DISPOSABLE) IMPLANT
STAPLER VISISTAT 35W (STAPLE) ×2 IMPLANT
STRIP CLOSURE SKIN 1/2X4 (GAUZE/BANDAGES/DRESSINGS) IMPLANT
SUCTION FRAZIER TIP 10 FR DISP (SUCTIONS) ×2 IMPLANT
SUT FIBERWIRE #2 38 T-5 BLUE (SUTURE) ×4
SUT VIC AB 0 CT1 27 (SUTURE) ×2
SUT VIC AB 0 CT1 27XBRD ANBCTR (SUTURE) ×2 IMPLANT
SUT VIC AB 1 CT1 18XCR BRD 8 (SUTURE) ×1 IMPLANT
SUT VIC AB 1 CT1 27 (SUTURE) ×1
SUT VIC AB 1 CT1 27XBRD ANBCTR (SUTURE) ×1 IMPLANT
SUT VIC AB 1 CT1 8-18 (SUTURE) ×1
SUT VIC AB 2-0 CT1 27 (SUTURE) ×2
SUT VIC AB 2-0 CT1 TAPERPNT 27 (SUTURE) ×2 IMPLANT
SUTURE FIBERWR #2 38 T-5 BLUE (SUTURE) ×2 IMPLANT
TOWEL OR 17X24 6PK STRL BLUE (TOWEL DISPOSABLE) ×2 IMPLANT
TOWEL OR 17X26 10 PK STRL BLUE (TOWEL DISPOSABLE) ×4 IMPLANT
TRAY FOLEY CATH 16FRSI W/METER (SET/KITS/TRAYS/PACK) IMPLANT
WATER STERILE IRR 1000ML POUR (IV SOLUTION) IMPLANT

## 2013-06-22 NOTE — Progress Notes (Signed)
Back from OR.  To floor. Stable.

## 2013-06-22 NOTE — Transfer of Care (Signed)
Immediate Anesthesia Transfer of Care Note  Patient: Troy Hunt  Procedure(s) Performed: Procedure(s): OPEN REDUCTION INTERNAL FIXATION (ORIF) ACETABULAR FRACTURE (Right)  Patient Location: PACU  Anesthesia Type:General  Level of Consciousness: awake, alert  and oriented  Airway & Oxygen Therapy: Patient Spontanous Breathing and Patient connected to nasal cannula oxygen  Post-op Assessment: Report given to PACU RN and Post -op Vital signs reviewed and stable  Post vital signs: Reviewed and stable  Complications: No apparent anesthesia complications

## 2013-06-22 NOTE — Progress Notes (Signed)
Total dose of Dilaudid is 4.2 mg; Demands: 16; Delivered 14
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
## 2013-06-22 NOTE — Preoperative (Signed)
Beta Blockers   Reason not to administer Beta Blockers:Not Applicable 
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
## 2013-06-22 NOTE — Brief Op Note (Signed)
06/20/2013 - 06/22/2013  1:09 PM  PATIENT:  Troy Hunt  42 y.o. male  PRE-OPERATIVE DIAGNOSIS:  Right posterior column and posterior wall acetabular fracture  POST-OPERATIVE DIAGNOSIS:  Right posterior column and posterior wall acetabular fracture  PROCEDURE:  Procedure(s): OPEN REDUCTION INTERNAL FIXATION (ORIF) Right posterior column and posterior wall ACETABULAR FRACTURE  SURGEON:  Surgeon(s) and Role:    * Budd Palmer, MD - Primary  PHYSICIAN ASSISTANT: Montez Morita, PAC  ASSISTANTS: none   ANESTHESIA:   general  EBL:  Total I/O In: 2500 [I.V.:2000; IV Piggyback:500] Out: 520 [Urine:220; Blood:300]  BLOOD ADMINISTERED:none  DRAINS: none   LOCAL MEDICATIONS USED:  NONE  SPECIMEN:  No Specimen  DISPOSITION OF SPECIMEN:  N/A  COUNTS:  YES  TOURNIQUET:  * No tourniquets in log *  DICTATION: .Other Dictation: Dictation Number [id number]  PLAN OF CARE: Admit to inpatient   PATIENT DISPOSITION:  PACU - hemodynamically stable.   Delay start of Pharmacological VTE agent (>24hrs) due to surgical blood loss or risk of bleeding: no
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
## 2013-06-22 NOTE — Anesthesia Postprocedure Evaluation (Signed)
  Anesthesia Post-op Note  Patient: Troy Hunt  Procedure(s) Performed: Procedure(s): OPEN REDUCTION INTERNAL FIXATION (ORIF) ACETABULAR FRACTURE (Right)  Patient Location: PACU  Anesthesia Type:General  Level of Consciousness: awake, alert  and oriented  Airway and Oxygen Therapy: Patient Spontanous Breathing and Patient connected to nasal cannula oxygen  Post-op Pain: mild  Post-op Assessment: Post-op Vital signs reviewed, Patient's Cardiovascular Status Stable, Respiratory Function Stable, Patent Airway and Pain level controlled  Post-op Vital Signs: stable  Complications: No apparent anesthesia complications

## 2013-06-22 NOTE — Progress Notes (Signed)
Orthopedic Tech Progress Note Patient Details:  Troy Hunt 27-Dec-1970 [id number]  Patient ID: [name], male   DOB: 1971/08/07, 42 y.o.   MRN: [id number] Trapeze bar patient helper  Nikki Dom 06/22/2013, 5:37 PM

## 2013-06-23 ENCOUNTER — Telehealth: Payer: Self-pay

## 2013-06-23 ENCOUNTER — Ambulatory Visit: Payer: MEDICAID | Admitting: Radiation Oncology

## 2013-06-23 DIAGNOSIS — R319 Hematuria, unspecified: Secondary | ICD-10-CM

## 2013-06-23 LAB — BASIC METABOLIC PANEL
BUN: 13 mg/dL (ref 6–23)
Chloride: 95 mEq/L — ABNORMAL LOW (ref 96–112)
Creatinine, Ser: 0.81 mg/dL (ref 0.50–1.35)
GFR calc Af Amer: >90 mL/min (ref >90)
GFR calc non Af Amer: >90 mL/min (ref >90)
Glucose, Bld: 131 mg/dL — ABNORMAL HIGH (ref 70–99)

## 2013-06-23 LAB — CLOSTRIDIUM DIFFICILE BY PCR: Toxigenic C. Difficile by PCR: NEGATIVE

## 2013-06-23 LAB — POCT I-STAT 4, (NA,K, GLUC, HGB,HCT)
Glucose, Bld: 119 mg/dL — ABNORMAL HIGH (ref 70–99)
HCT: 32 % — ABNORMAL LOW (ref 39.0–52.0)
Hemoglobin: 10.9 g/dL — ABNORMAL LOW (ref 13.0–17.0)
Potassium: 3.3 meq/L — ABNORMAL LOW (ref 3.5–5.1)
Sodium: 134 meq/L — ABNORMAL LOW (ref 135–145)

## 2013-06-23 LAB — CBC
MCH: 32.2 pg (ref 26.0–34.0)
MCV: 89.3 fL (ref 78.0–100.0)
Platelets: 196 10*3/uL (ref 150–400)
RBC: 3.38 MIL/uL — ABNORMAL LOW (ref 4.22–5.81)
RDW: 12.5 % (ref 11.5–15.5)

## 2013-06-23 MED ORDER — WARFARIN SODIUM 7.5 MG PO TABS
7.5000 mg | ORAL_TABLET | Freq: Once | ORAL | Status: AC
  Administered 2013-06-23: 7.5 mg via ORAL
  Filled 2013-06-23: qty 1

## 2013-06-23 MED ORDER — OXYCODONE-ACETAMINOPHEN 5-325 MG PO TABS
1.0000 | ORAL_TABLET | Freq: Four times a day (QID) | ORAL | Status: DC | PRN
Start: 2013-06-23 — End: 2013-06-25
  Administered 2013-06-24: 2 via ORAL
  Filled 2013-06-23: qty 2

## 2013-06-23 NOTE — Progress Notes (Signed)
Transportation arrangements made with [facility] for [facility]  @ 0830.
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
## 2013-06-23 NOTE — Telephone Encounter (Signed)
Spoke with Marylene Land RN step sown ICU.Informed to schedule [facility] to have patient at [facility] by 8:30 am for consent, simulation and treatment and that patient will be here until after 1:00 pm.Give pain medication prior to leaving unit.Contact number for [place] [phone].
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
## 2013-06-24 ENCOUNTER — Encounter: Payer: Self-pay | Admitting: Radiation Oncology

## 2013-06-24 ENCOUNTER — Ambulatory Visit
Admit: 2013-06-24 | Discharge: 2013-06-24 | Disposition: A | Discharge status: Home / Self Care | Payer: No Typology Code available for payment source | Attending: Radiation Oncology | Admitting: Radiation Oncology

## 2013-06-24 ENCOUNTER — Inpatient Hospital Stay (HOSPITAL_COMMUNITY): Payer: Medicaid Other

## 2013-06-24 VITALS — BP 133/86 | HR 94 | Temp 98.1°F | Resp 16

## 2013-06-24 DIAGNOSIS — R197 Diarrhea, unspecified: Secondary | ICD-10-CM

## 2013-06-24 DIAGNOSIS — S32401B Unspecified fracture of right acetabulum, initial encounter for open fracture: Secondary | ICD-10-CM

## 2013-06-24 DIAGNOSIS — E876 Hypokalemia: Secondary | ICD-10-CM

## 2013-06-24 LAB — BASIC METABOLIC PANEL
BUN: 13 mg/dL (ref 6–23)
Calcium: 7.6 mg/dL — ABNORMAL LOW (ref 8.4–10.5)
Creatinine, Ser: 0.78 mg/dL (ref 0.50–1.35)
GFR calc Af Amer: >90 mL/min (ref >90)
GFR calc non Af Amer: >90 mL/min (ref >90)
Potassium: 3.2 mEq/L — ABNORMAL LOW (ref 3.5–5.1)

## 2013-06-24 LAB — CBC
HCT: 26.6 % — ABNORMAL LOW (ref 39.0–52.0)
MCH: 32.4 pg (ref 26.0–34.0)
MCHC: 36.5 g/dL — ABNORMAL HIGH (ref 30.0–36.0)
MCV: 89 fL (ref 78.0–100.0)
Platelets: 199 10*3/uL (ref 150–400)
RDW: 12.5 % (ref 11.5–15.5)

## 2013-06-24 MED ORDER — WARFARIN SODIUM 7.5 MG PO TABS
7.5000 mg | ORAL_TABLET | Freq: Once | ORAL | Status: AC
  Administered 2013-06-24: 7.5 mg via ORAL
  Filled 2013-06-24: qty 1

## 2013-06-24 MED ORDER — DOCUSATE SODIUM 100 MG PO CAPS
100.0000 mg | ORAL_CAPSULE | Freq: Two times a day (BID) | ORAL | Status: DC | PRN
  Filled 2013-06-24: qty 1

## 2013-06-24 MED ORDER — POTASSIUM CHLORIDE CRYS ER 20 MEQ PO TBCR
40.0000 meq | EXTENDED_RELEASE_TABLET | Freq: Every day | ORAL | Status: DC
  Administered 2013-06-25 – 2013-06-29 (×5): 40 meq via ORAL
  Filled 2013-06-24 (×6): qty 2

## 2013-06-24 MED ORDER — POTASSIUM CHLORIDE 10 MEQ/100ML IV SOLN
10.0000 meq | INTRAVENOUS | Status: AC
  Administered 2013-06-24 (×4): 10 meq via INTRAVENOUS
  Filled 2013-06-24: qty 100

## 2013-06-24 NOTE — Progress Notes (Signed)
Patient ID: Troy Hunt, male   DOB: 05/04/1971, 42 y.o.   MRN: [id number] Trauma Service Note  Subjective: Continues to have diarrhea and crampy abdominal pain.  Complains of bloating.    Objective: Vital signs in last 24 hours: Temp:  [98 F (36.7 C)-98.4 F (36.9 C)] 98.1 F (36.7 C) (10/09 0428) Pulse Rate:  [89-102] 94 (10/09 0428) Resp:  [13-29] 29 (10/09 0428) BP: (120-143)/(65-82) 143/76 mmHg (10/09 0428) SpO2:  [91 %-97 %] 92 % (10/09 0428) Weight:  [209 lb 10.5 oz (95.1 kg)] 209 lb 10.5 oz (95.1 kg) (10/09 0428) Last BM Date: 06/23/13  Intake/Output from previous day: 10/08 0701 - 10/09 0700 In: -  Out: 1632 [Urine:1630; Stool:2] Intake/Output this shift:    General: looks uncomfortable  Lungs: Clear  Abd: Distended, hypoactive bowel sounds.  Non tender.    Extremities: No changes. Dressings c/d/i.    Neuro: Intact  Lab Results: CBC   Recent Labs  06/23/13 0719 06/24/13 0410  WBC 11.1* 9.2  HGB 10.9* 9.7*  HCT 30.2* 26.6*  PLT 196 199   BMET  Recent Labs  06/23/13 0719 06/24/13 0410  NA 131* 130*  K 3.4* 3.2*  CL 95* 93*  CO2 28 29  GLUCOSE 131* 118*  BUN 13 13  CREATININE 0.81 0.78  CALCIUM 7.7* 7.6*   PT/INR  Recent Labs  06/23/13 0719 06/24/13 0410  LABPROT 15.2 18.9*  INR 1.23 1.63*   ABG No results found for this basename: PHART, PCO2, PO2, HCO3,  in the last 72 hours  Studies/Results: Dg Pelvis Comp Min 3v  06/22/2013   CLINICAL DATA:  Postop acetabulum fixation.  EXAM: JUDET PELVIS - 3+ VIEW  COMPARISON:  06/20/2013  FINDINGS: Examination demonstrates fixation plates with screws impression patient's known right acetabular fracture as there is anatomic alignment about the fracture site with hardware intact. There is normal anatomic alignment of the patient's known right inferior pubic ramus fracture. Remainder of the exam is unchanged.  IMPRESSION: Anatomic alignment about patient's right acetabular fracture post  fixation with hardware intact. Anatomic alignment over patient's right superior pubic ramus fracture.   Electronically Signed   By: Elberta Fortis M.D.   On: 06/22/2013 15:51   Dg Pelvis Comp Min 3v  06/22/2013   CLINICAL DATA:  Acetabular fracture  EXAM: DG C-ARM 61-120 MIN; JUDET PELVIS - 3+ VIEW  COMPARISON:  06/21/2013  FINDINGS: Images demonstrate placement of plates and screws transfixing a right acetabular fracture. Anatomic alignment of the osseous structures. No breakage or loosening of the hardware.  IMPRESSION: ORIF right acetabular fracture.   Electronically Signed   By: Maryclare Bean M.D.   On: 06/22/2013 14:59   Dg C-arm 61-120 Min  06/22/2013   CLINICAL DATA:  Acetabular fracture  EXAM: DG C-ARM 61-120 MIN; JUDET PELVIS - 3+ VIEW  COMPARISON:  06/21/2013  FINDINGS: Images demonstrate placement of plates and screws transfixing a right acetabular fracture. Anatomic alignment of the osseous structures. No breakage or loosening of the hardware.  IMPRESSION: ORIF right acetabular fracture.   Electronically Signed   By: Maryclare Bean M.D.   On: 06/22/2013 14:59    Anti-infectives: Anti-infectives   Start     Dose/Rate Route Frequency Ordered Stop   06/22/13 1800  ceFAZolin (ANCEF) IVPB 1 g/50 mL premix     1 g 100 mL/hr over 30 Minutes Intravenous Every 6 hours 06/22/13 1443 06/23/13 0634   06/22/13 0600  [MAR Hold]  ceFAZolin (ANCEF) IVPB 2  g/50 mL premix     (On MAR Hold since 06/22/13 0658)   2 g 100 mL/hr over 30 Minutes Intravenous On call to O.R. 06/21/13 0909 06/22/13 0912      Assessment/Plan: s/p Procedure(s): OPEN REDUCTION INTERNAL FIXATION (ORIF) ACETABULAR FRACTURE D/c foley continue therapy Transfer to Orthopedic floor. Check KUB Replete hypokalemia with PO and IV, PO to be daily.      LOS: 4 days  06/24/2013

## 2013-06-24 NOTE — Progress Notes (Signed)
Pt has a bed on 5N07, and report has been given to the receiving rn, Jamie. Pt initially refused to be moved off SD when he had a bed on the floor, stating that he wanted to be moved in the morning and not tonight and that he wanted his pain to be well taken care of.  After explaining to the pt that his pain management will be continued and that he will be made comfortable on the floor, pt agreed to be moved to the floor.-------Elaijah Munoz, rn

## 2013-06-24 NOTE — Progress Notes (Signed)
Dilaudid 1 mg IV given for pain level of 8 on a scale of 0-10 at 10:50am.  His IV site is intact without any redness nor swelling.  His O2 sat is 93%.  Pain reduced to a level 5 at 11:20am.  He is currently sleeping. He will receive his radiation therapy at 1300.

## 2013-06-24 NOTE — Progress Notes (Signed)
Troy Hunt has received his simulation to his right hip.  He tolerated the procedure wtihout any difficulty.  His vitals remain stable and he is presently lying on a stretcher.  His IV site i his right wrist is without pain nor swelling.  Receiving 0.9 Ns at 75 ml/hr and is also receiving Potassium 10 mEq  100 ml q 1 hour x 4 since his K+ was 3.2  And  is presently receiving the 2nd dose .  He c/o pain in his right hip as a level 8 on a scale of 0-10.  He received Oxy-IR 10mg  po at 0800 prior to transport to Radiation Oncology.
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
## 2013-06-24 NOTE — Progress Notes (Signed)
PT Cancellation Note  Patient Details Name: VIRGLE ARTH MRN: [id number] DOB: 1970-10-03   Cancelled Treatment:    Reason Eval/Treat Not Completed: Patient at procedure or test/unavailable (pt being transferred to [facility] center currently and unable to participate)   Delorse Lek 06/24/2013, 8:05 AM Delaney Meigs, PT [phone]
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
## 2013-06-25 ENCOUNTER — Encounter (HOSPITAL_COMMUNITY): Payer: Self-pay | Admitting: Emergency Medicine

## 2013-06-25 DIAGNOSIS — K561 Intussusception: Secondary | ICD-10-CM

## 2013-06-25 LAB — CBC
HCT: 24.7 % — ABNORMAL LOW (ref 39.0–52.0)
MCH: 32.4 pg (ref 26.0–34.0)
MCHC: 36.4 g/dL — ABNORMAL HIGH (ref 30.0–36.0)
MCV: 86.6 fL (ref 78.0–100.0)
Platelets: 212 10*3/uL (ref 150–400)
RBC: 2.84 MIL/uL — ABNORMAL LOW (ref 4.22–5.81)
WBC: 8 10*3/uL (ref 4.0–10.5)

## 2013-06-25 LAB — BASIC METABOLIC PANEL
BUN: 10 mg/dL (ref 6–23)
CO2: 29 mEq/L (ref 19–32)
Calcium: 8 mg/dL — ABNORMAL LOW (ref 8.4–10.5)
Calcium: 8 mg/dL — ABNORMAL LOW (ref 8.4–10.5)
Chloride: 93 mEq/L — ABNORMAL LOW (ref 96–112)
Creatinine, Ser: 0.6 mg/dL (ref 0.50–1.35)
Creatinine, Ser: 0.58 mg/dL (ref 0.50–1.35)
GFR calc Af Amer: >90 mL/min (ref >90)
GFR calc non Af Amer: >90 mL/min (ref >90)
Glucose, Bld: 116 mg/dL — ABNORMAL HIGH (ref 70–99)
Glucose, Bld: 114 mg/dL — ABNORMAL HIGH (ref 70–99)
Potassium: 2.6 mEq/L — CL (ref 3.5–5.1)
Potassium: 2.5 mEq/L — CL (ref 3.5–5.1)
Sodium: 130 mEq/L — ABNORMAL LOW (ref 135–145)

## 2013-06-25 LAB — PROTIME-INR
INR: 2.58 — ABNORMAL HIGH (ref 0.00–1.49)
Prothrombin Time: 26.8 seconds — ABNORMAL HIGH (ref 11.6–15.2)

## 2013-06-25 MED ORDER — METOCLOPRAMIDE HCL 5 MG/ML IJ SOLN
10.0000 mg | Freq: Four times a day (QID) | INTRAMUSCULAR | Status: DC
  Administered 2013-06-25 – 2013-06-29 (×16): 10 mg via INTRAVENOUS
  Filled 2013-06-25 (×20): qty 2

## 2013-06-25 MED ORDER — LOPERAMIDE HCL 2 MG PO CAPS
4.0000 mg | ORAL_CAPSULE | ORAL | Status: DC | PRN
  Filled 2013-06-25: qty 2

## 2013-06-25 MED ORDER — OXYCODONE HCL 5 MG PO TABS
10.0000 mg | ORAL_TABLET | ORAL | Status: DC | PRN
  Administered 2013-06-25: 10 mg via ORAL
  Administered 2013-06-25: 15 mg via ORAL
  Administered 2013-06-25 – 2013-06-29 (×19): 20 mg via ORAL
  Filled 2013-06-25 (×15): qty 4
  Filled 2013-06-25: qty 3
  Filled 2013-06-25 (×4): qty 4
  Filled 2013-06-25: qty 2

## 2013-06-25 MED ORDER — SODIUM CHLORIDE 0.9 % IV SOLN
INTRAVENOUS | Status: DC
  Administered 2013-06-25 – 2013-06-28 (×6): via INTRAVENOUS
  Filled 2013-06-25 (×9): qty 1000

## 2013-06-25 MED ORDER — WARFARIN SODIUM 5 MG PO TABS
5.0000 mg | ORAL_TABLET | Freq: Once | ORAL | Status: AC
  Administered 2013-06-25: 5 mg via ORAL
  Filled 2013-06-25: qty 1

## 2013-06-25 MED ORDER — HYDROMORPHONE HCL PF 1 MG/ML IJ SOLN: 1.0000 mg | INTRAMUSCULAR | Status: DC | PRN

## 2013-06-25 MED ORDER — POTASSIUM CHLORIDE 10 MEQ/100ML IV SOLN
10.0000 meq | INTRAVENOUS | Status: AC
  Administered 2013-06-25 (×6): 10 meq via INTRAVENOUS
  Filled 2013-06-25 (×6): qty 100

## 2013-06-25 MED ORDER — MORPHINE SULFATE ER 15 MG PO TBCR
30.0000 mg | EXTENDED_RELEASE_TABLET | Freq: Two times a day (BID) | ORAL | Status: DC
  Administered 2013-06-25 – 2013-06-29 (×9): 30 mg via ORAL
  Filled 2013-06-25 (×9): qty 2

## 2013-06-25 NOTE — Progress Notes (Addendum)
Thank you for consult on Mr. Troy Hunt. Chart reviewed and note that patient is progressing well with therapy. Anticipate that patient will get to supervision level with continued therapies over the weekend. Home health for follow up is recommended by PT/OT and would concur. Will defer CIR consult for now.

## 2013-06-25 NOTE — Progress Notes (Signed)
Diarrhea, cramping Negative c. Diff on 10/8. Normal WBC  Will start imodium PRN  K 2.5 - will supplement aggressively today.  Wilmon Arms. Corliss Skains, MD, [facility] Surgery  General/ Trauma Surgery  06/25/2013 9:58 AM

## 2013-06-25 NOTE — Progress Notes (Signed)
Critical Value Alert  Critical Value: Potassium - 2.5  Date 06/25/13 Time 0910 Nurse who received alert: Doyle Askew Md notified: Montez Morita, PA-C - was on the floor, notified Charma Igo, [place] via test Orders received for 6 runs of K today, continuous fluid with added K

## 2013-06-25 NOTE — Progress Notes (Signed)
CRITICAL VALUE ALERT  Critical value received:  Potassium 2.6  Date of notification:  06/25/13  Time of notification:  0707  Critical value read back:yes  Nurse who received alert:  Rella Larve, RN  MD notified (1st page):  Lindwood Qua, PA  Time of first page:  0715  MD notified (2nd page):  Time of second page:  Responding MD:  Lindwood Qua, PA  Time MD responded:  0719-order received to redraw STAT BMP
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
## 2013-06-25 NOTE — Progress Notes (Signed)
UR completed.  Mckinzi Eriksen, RN BSN MHA CCM Trauma/Neuro ICU Case Manager [phone]  

## 2013-06-25 NOTE — Progress Notes (Signed)
Pt has been transferred off the floor-----Keylah Darwish, rn
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
## 2013-06-26 ENCOUNTER — Inpatient Hospital Stay (HOSPITAL_COMMUNITY): Payer: Medicaid Other

## 2013-06-26 LAB — CBC
HCT: 25.4 % — ABNORMAL LOW (ref 39.0–52.0)
Hemoglobin: 9.2 g/dL — ABNORMAL LOW (ref 13.0–17.0)
MCV: 87.3 fL (ref 78.0–100.0)
WBC: 6.5 10*3/uL (ref 4.0–10.5)

## 2013-06-26 LAB — BASIC METABOLIC PANEL
BUN: 10 mg/dL (ref 6–23)
Chloride: 98 mEq/L (ref 96–112)
Creatinine, Ser: 0.6 mg/dL (ref 0.50–1.35)
GFR calc Af Amer: >90 mL/min (ref >90)
Glucose, Bld: 98 mg/dL (ref 70–99)
Potassium: 3.1 mEq/L — ABNORMAL LOW (ref 3.5–5.1)

## 2013-06-26 LAB — PROTIME-INR: INR: 3.62 — ABNORMAL HIGH (ref 0.00–1.49)

## 2013-06-26 MED ORDER — COUMADIN BOOK
Freq: Once | Status: AC
  Administered 2013-06-26: 18:00:00
  Filled 2013-06-26: qty 1

## 2013-06-26 MED ORDER — WARFARIN VIDEO: Freq: Once | Status: DC

## 2013-06-26 MED ORDER — BISACODYL 10 MG RE SUPP
10.0000 mg | Freq: Once | RECTAL | Status: AC
  Administered 2013-06-26: 10 mg via RECTAL
  Filled 2013-06-26: qty 1

## 2013-06-26 MED ORDER — POTASSIUM CHLORIDE CRYS ER 20 MEQ PO TBCR
40.0000 meq | EXTENDED_RELEASE_TABLET | Freq: Once | ORAL | Status: AC
  Administered 2013-06-26: 40 meq via ORAL
  Filled 2013-06-26: qty 2
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
## 2013-06-26 NOTE — Progress Notes (Signed)
Agree Seen by Dr. Janee Morn as well
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
## 2013-06-27 LAB — BASIC METABOLIC PANEL
BUN: 10 mg/dL (ref 6–23)
CO2: 27 mEq/L (ref 19–32)
Chloride: 96 mEq/L (ref 96–112)
Creatinine, Ser: 0.67 mg/dL (ref 0.50–1.35)
GFR calc Af Amer: >90 mL/min (ref >90)
Glucose, Bld: 104 mg/dL — ABNORMAL HIGH (ref 70–99)
Potassium: 3.6 mEq/L (ref 3.5–5.1)

## 2013-06-27 LAB — CBC
HCT: 25.6 % — ABNORMAL LOW (ref 39.0–52.0)
Hemoglobin: 9.3 g/dL — ABNORMAL LOW (ref 13.0–17.0)
MCV: 87.7 fL (ref 78.0–100.0)
RBC: 2.92 MIL/uL — ABNORMAL LOW (ref 4.22–5.81)
RDW: 12.2 % (ref 11.5–15.5)
WBC: 6.4 10*3/uL (ref 4.0–10.5)

## 2013-06-27 LAB — PROTIME-INR: INR: 3.24 — ABNORMAL HIGH (ref 0.00–1.49)

## 2013-06-27 MED ORDER — WARFARIN SODIUM 1 MG PO TABS
1.0000 mg | ORAL_TABLET | Freq: Once | ORAL | Status: AC
  Administered 2013-06-27: 1 mg via ORAL
  Filled 2013-06-27: qty 1

## 2013-06-27 MED ORDER — WARFARIN VIDEO
Freq: Once | Status: AC
  Administered 2013-06-27: 18:00:00

## 2013-06-27 MED ORDER — WHITE PETROLATUM GEL
Status: AC
  Filled 2013-06-27: qty 5

## 2013-06-27 MED ORDER — POTASSIUM CHLORIDE 10 MEQ/100ML IV SOLN
10.0000 meq | INTRAVENOUS | Status: AC
  Administered 2013-06-27 (×4): 10 meq via INTRAVENOUS
  Filled 2013-06-27 (×4): qty 100

## 2013-06-27 NOTE — Progress Notes (Signed)
Physical Therapy Treatment Patient Details Name: Troy Hunt MRN: [id number] DOB: 1971/07/23 Today's Date: 06/27/2013 Time: 6962-9528 PT Time Calculation (min): 16 min  PT Assessment / Plan / Recommendation  History of Present Illness     PT Comments   Pt reports feeling better today.  Excellent progress noted with gait distance.  Pt did become fatigued during gait requiring 2-3 standing rest breaks.  Follow Up Recommendations  Home health PT;Supervision/Assistance - 24 hour     Does the patient have the potential to tolerate intense rehabilitation     Barriers to Discharge        Equipment Recommendations  Rolling walker with 5" wheels;3in1 (PT)    Recommendations for Other Services    Frequency Min 6X/week   Progress towards PT Goals Progress towards PT goals: Progressing toward goals  Plan Current plan remains appropriate    Precautions / Restrictions Precautions Precautions: Fall;Posterior Hip Precaution Comments: Reviewed 3/3 posterior hip precautions. Restrictions RLE Weight Bearing: Touchdown weight bearing   Pertinent Vitals/Pain     Mobility  Bed Mobility Supine to Sit: 4: Min assist;With rails Sitting - Scoot to Edge of Bed: 5: Supervision Details for Bed Mobility Assistance: assist with RLE Transfers Sit to Stand: From bed;With upper extremity assist;4: Min assist Stand to Sit: 4: Min guard;To chair/3-in-1;With upper extremity assist;With armrests Details for Transfer Assistance: verbal cues for hand placement Ambulation/Gait Ambulation/Gait Assistance: 4: Min guard Ambulation Distance (Feet): 160 Feet Assistive device: Rolling walker Ambulation/Gait Assistance Details: pt opts to perform NWB RLE instead of TDWB Gait Pattern: Step-to pattern    Exercises     PT Diagnosis:    PT Problem List:   PT Treatment Interventions:     PT Goals (current goals can now be found in the care plan section)    Visit Information  Last PT Received On:  06/27/13 Assistance Needed: +1    Subjective Data      Cognition  Cognition Arousal/Alertness: Awake/alert Behavior During Therapy: Flat affect Overall Cognitive Status: Within Functional Limits for tasks assessed    Balance     End of Session PT - End of Session Equipment Utilized During Treatment: Gait belt Activity Tolerance: Patient tolerated treatment well Patient left: in chair;with call bell/phone within reach Nurse Communication: Mobility status   GP     Ilda Foil 06/27/2013, 12:42 PM  Aida Raider, PT  Office # [phone] Pager [phone]

## 2013-06-27 NOTE — Progress Notes (Signed)
Abd a bit softer. Passing gas. Replete K. Patient examined and I agree with the assessment and plan  Violeta Gelinas, MD, MPH, FACS Pager: [phone]  06/27/2013 11:01 AM
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
## 2013-06-28 ENCOUNTER — Encounter (HOSPITAL_COMMUNITY): Payer: Self-pay | Admitting: Orthopedic Surgery

## 2013-06-28 LAB — BASIC METABOLIC PANEL
BUN: 8 mg/dL (ref 6–23)
BUN: 8 mg/dL (ref 6–23)
CO2: 25 mEq/L (ref 19–32)
CO2: 28 mEq/L (ref 19–32)
Chloride: 94 mEq/L — ABNORMAL LOW (ref 96–112)
Chloride: 93 mEq/L — ABNORMAL LOW (ref 96–112)
Creatinine, Ser: 0.66 mg/dL (ref 0.50–1.35)
GFR calc Af Amer: >90 mL/min (ref >90)
GFR calc Af Amer: >90 mL/min (ref >90)
GFR calc non Af Amer: >90 mL/min (ref >90)
GFR calc non Af Amer: >90 mL/min (ref >90)
Glucose, Bld: 106 mg/dL — ABNORMAL HIGH (ref 70–99)
Potassium: 4.3 mEq/L (ref 3.5–5.1)
Potassium: 4.5 mEq/L (ref 3.5–5.1)
Sodium: 128 mEq/L — ABNORMAL LOW (ref 135–145)

## 2013-06-28 LAB — PROTIME-INR
INR: 2.93 — ABNORMAL HIGH (ref 0.00–1.49)
Prothrombin Time: 29.5 seconds — ABNORMAL HIGH (ref 11.6–15.2)

## 2013-06-28 MED ORDER — WARFARIN SODIUM 5 MG PO TABS
5.0000 mg | ORAL_TABLET | Freq: Once | ORAL | Status: AC
  Administered 2013-06-28: 5 mg via ORAL
  Filled 2013-06-28: qty 1

## 2013-06-28 MED ORDER — POLYETHYLENE GLYCOL 3350 17 G PO PACK
17.0000 g | PACK | Freq: Every day | ORAL | Status: DC
  Administered 2013-06-28: 17 g via ORAL
  Filled 2013-06-28 (×3): qty 1
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
## 2013-06-28 NOTE — Addendum Note (Signed)
Encounter addended by: Tessa Lerner, RN on: 06/28/2013 10:32 AM<BR>     Documentation filed: Charges VN

## 2013-06-28 NOTE — Progress Notes (Signed)
Abdomen a lot softer. Advance diet and continue therapies. I spoke to his wife as well. Patient examined and I agree with the assessment and plan  Violeta Gelinas, MD, MPH, FACS Pager: [phone]  06/28/2013 11:30 AM
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
## 2013-06-29 LAB — BASIC METABOLIC PANEL
BUN: 8 mg/dL (ref 6–23)
CO2: 26 mEq/L (ref 19–32)
Calcium: 8.7 mg/dL (ref 8.4–10.5)
Chloride: 96 mEq/L (ref 96–112)
GFR calc Af Amer: >90 mL/min (ref >90)
Glucose, Bld: 120 mg/dL — ABNORMAL HIGH (ref 70–99)
Potassium: 4.7 mEq/L (ref 3.5–5.1)

## 2013-06-29 LAB — PROTIME-INR: Prothrombin Time: 24.6 seconds — ABNORMAL HIGH (ref 11.6–15.2)

## 2013-06-29 MED ORDER — WARFARIN SODIUM 5 MG PO TABS: 5.0000 mg | ORAL_TABLET | Freq: Every day | ORAL | Status: DC

## 2013-06-29 MED ORDER — POLYETHYLENE GLYCOL 3350 17 G PO PACK: 17.0000 g | PACK | Freq: Every day | ORAL | Status: AC

## 2013-06-29 MED ORDER — LISINOPRIL 20 MG PO TABS: 20.0000 mg | ORAL_TABLET | Freq: Every day | ORAL | Status: DC

## 2013-06-29 MED ORDER — OXYCODONE HCL 10 MG PO TABS: 10.0000 mg | ORAL_TABLET | ORAL | Status: AC | PRN

## 2013-06-29 MED ORDER — METHOCARBAMOL 500 MG PO TABS: 500.0000 mg | ORAL_TABLET | Freq: Four times a day (QID) | ORAL | Status: AC | PRN

## 2013-06-29 MED ORDER — MORPHINE SULFATE ER 30 MG PO TBCR: 30.0000 mg | EXTENDED_RELEASE_TABLET | Freq: Two times a day (BID) | ORAL | Status: AC

## 2013-06-29 NOTE — Progress Notes (Signed)
Pt will d/c home to address and phone listed in EPIC.  He has access to a family member's rolling walker and bedside commode so those will not be ordered. HHRN ordered from [facility] for lab draws for Coumadin mgmt. Pt wishes to speak with a rep from [facility] re: pt assistance before agreeing to HHPT in order to limit his financial burden.   Pt will be enrolled in the [facility] program for his meds at discharge.  He will also be referred to the [facility] and [facility] on [address] [phone] to establish with a primary care.
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
## 2013-06-29 NOTE — Progress Notes (Signed)
PT Cancellation Note  Patient Details Name: Troy Hunt MRN: [id number] DOB: [birth year]   Cancelled Treatment:     Pt declining therapy at this time due to pain & wanting to finish breakfast.  Pt states RN is going to bring pain medication when he finishes breakfast.        Verdell Face, PTA [phone] 06/29/2013

## 2013-06-29 NOTE — Discharge Summary (Signed)
Zenith Lamphier, MD, MPH, FACS Pager: [phone]  
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
## 2013-12-23 ENCOUNTER — Other Ambulatory Visit (HOSPITAL_COMMUNITY): Payer: Self-pay | Admitting: Orthopaedic Surgery

## 2013-12-23 ENCOUNTER — Other Ambulatory Visit (HOSPITAL_COMMUNITY): Payer: Self-pay

## 2013-12-24 ENCOUNTER — Encounter (HOSPITAL_COMMUNITY): Payer: Self-pay | Admitting: Pharmacy Technician

## 2013-12-28 ENCOUNTER — Other Ambulatory Visit (HOSPITAL_COMMUNITY): Payer: Self-pay | Admitting: Orthopaedic Surgery

## 2013-12-28 NOTE — Patient Instructions (Addendum)
19 Troy Hunt  12/28/2013   Your procedure is scheduled on: Friday April 24th, 2015  Report to [facility] at  930 AM.  Call this number if you have problems the morning of surgery [phone]   Remember:  Do not eat food or drink liquids :After Midnight.     Take these medicines the morning of surgery with A SIP OF WATER: percocet if needed                               You may not have any metal on your body including hair pins and piercings  Do not wear jewelry, make-up, lotions, powders, or deodorant.   Men may shave face and neck.  Do not bring valuables to the hospital. [facility] IS NOT RESPONSIBLE FOR VALUABLES.  Contacts, dentures or bridgework may not be worn into surgery.  Leave suitcase in the car. After surgery it may be brought to your room.  For patients admitted to the hospital, checkout time is 11:00 AM the day of discharge.   Patients discharged the day of surgery will not be allowed to drive home.  Name and phone number of your driver:  Special Instructions: N/A   - Preparing for Surgery Before surgery, you can play an important role.  Because skin is not sterile, your skin needs to be as free of germs as possible.  You can reduce the number of germs on your skin by washing with CHG (chlorahexidine gluconate) soap before surgery.  CHG is an antiseptic cleaner which kills germs and bonds with the skin to continue killing germs even after washing. Please DO NOT use if you have an allergy to CHG or antibacterial soaps.  If your skin becomes reddened/irritated stop using the CHG and inform your nurse when you arrive at Short Stay. Do not shave (including legs and underarms) for at least 48 hours prior to the first CHG shower.  You may shave your face. Please follow these instructions carefully:  1.  Shower with CHG Soap the night before surgery and the  morning of Surgery.  2.  If you choose to wash your hair, wash your hair first as  usual with your  normal  shampoo.  3.  After you shampoo, rinse your hair and body thoroughly to remove the  shampoo.                           4.  Use CHG as you would any other liquid soap.  You can apply chg directly  to the skin and wash                       Gently with a scrungie or clean washcloth.  5.  Apply the CHG Soap to your body ONLY FROM THE NECK DOWN.   Do not use on open                           Wound or open sores. Avoid contact with eyes, ears mouth and genitals (private parts).                        Genitals (private parts) with your normal soap.             6.  Wash thoroughly, paying  special attention to the area where your surgery  will be performed.  7.  Thoroughly rinse your body with warm water from the neck down.  8.  DO NOT shower/wash with your normal soap after using and rinsing off  the CHG Soap.                9.  Pat yourself dry with a clean towel.            10.  Wear clean pajamas.            11.  Place clean sheets on your bed the night of your first shower and do not  sleep with pets. Day of Surgery : Do not apply any lotions/deodorants the morning of surgery.  Please wear clean clothes to the hospital/surgery center.  FAILURE TO FOLLOW THESE INSTRUCTIONS MAY RESULT IN THE CANCELLATION OF YOUR SURGERY PATIENT SIGNATURE_________________________________  NURSE SIGNATURE__________________________________  WHAT IS A BLOOD TRANSFUSION? Blood Transfusion Information  A transfusion is the replacement of blood or some of its parts. Blood is made up of multiple cells which provide different functions.  Red blood cells carry oxygen and are used for blood loss replacement.  White blood cells fight against infection.  Platelets control bleeding.  Plasma helps clot blood.  Other blood products are available for specialized needs, such as hemophilia or other clotting disorders. BEFORE THE TRANSFUSION  Who gives blood for transfusions?   Healthy volunteers  who are fully evaluated to make sure their blood is safe. This is blood bank blood. Transfusion therapy is the safest it has ever been in the practice of medicine. Before blood is taken from a donor, a complete history is taken to make sure that person has no history of diseases nor engages in risky social behavior (examples are intravenous drug use or sexual activity with multiple partners). The donor's travel history is screened to minimize risk of transmitting infections, such as malaria. The donated blood is tested for signs of infectious diseases, such as HIV and hepatitis. The blood is then tested to be sure it is compatible with you in order to minimize the chance of a transfusion reaction. If you or a relative donates blood, this is often done in anticipation of surgery and is not appropriate for emergency situations. It takes many days to process the donated blood. RISKS AND COMPLICATIONS Although transfusion therapy is very safe and saves many lives, the main dangers of transfusion include:   Getting an infectious disease.  Developing a transfusion reaction. This is an allergic reaction to something in the blood you were given. Every precaution is taken to prevent this. The decision to have a blood transfusion has been considered carefully by your caregiver before blood is given. Blood is not given unless the benefits outweigh the risks. AFTER THE TRANSFUSION  Right after receiving a blood transfusion, you will usually feel much better and more energetic. This is especially true if your red blood cells have gotten low (anemic). The transfusion raises the level of the red blood cells which carry oxygen, and this usually causes an energy increase.  The nurse administering the transfusion will monitor you carefully for complications. HOME CARE INSTRUCTIONS  No special instructions are needed after a transfusion. You may find your energy is better. Speak with your caregiver about any limitations  on activity for underlying diseases you may have. SEEK MEDICAL CARE IF:   Your condition is not improving after your transfusion.  You develop redness or irritation  at the intravenous (IV) site. SEEK IMMEDIATE MEDICAL CARE IF:  Any of the following symptoms occur over the next 12 hours:  Shaking chills.  You have a temperature by mouth above 102 F (38.9 C), not controlled by medicine.  Chest, back, or muscle pain.  People around you feel you are not acting correctly or are confused.  Shortness of breath or difficulty breathing.  Dizziness and fainting.  You get a rash or develop hives.  You have a decrease in urine output.  Your urine turns a dark color or changes to pink, red, or brown. Any of the following symptoms occur over the next 10 days:  You have a temperature by mouth above 102 F (38.9 C), not controlled by medicine.  Shortness of breath.  Weakness after normal activity.  The white part of the eye turns yellow (jaundice).  You have a decrease in the amount of urine or are urinating less often.  Your urine turns a dark color or changes to pink, red, or brown. Document Released: 08/30/2000 Document Revised: 11/25/2011 Document Reviewed: 04/18/2008 [facility] Patient Information 2014 [place], [place].

## 2013-12-30 ENCOUNTER — Encounter (HOSPITAL_COMMUNITY)
Admission: RE | Admit: 2013-12-30 | Discharge: 2013-12-30 | Disposition: A | Discharge status: Home / Self Care | Payer: Medicaid Other | Source: Ambulatory Visit | Attending: Orthopaedic Surgery | Admitting: Orthopaedic Surgery

## 2013-12-30 ENCOUNTER — Encounter (HOSPITAL_COMMUNITY): Payer: Self-pay

## 2013-12-30 DIAGNOSIS — Z01810 Encounter for preprocedural cardiovascular examination: Secondary | ICD-10-CM | POA: Insufficient documentation

## 2013-12-30 DIAGNOSIS — Z01812 Encounter for preprocedural laboratory examination: Secondary | ICD-10-CM | POA: Insufficient documentation

## 2013-12-30 HISTORY — DX: Idiopathic aseptic necrosis of unspecified bone: M87.00

## 2013-12-30 LAB — URINALYSIS, ROUTINE W REFLEX MICROSCOPIC
BILIRUBIN URINE: NEGATIVE
Glucose, UA: NEGATIVE mg/dL
HGB URINE DIPSTICK: NEGATIVE
KETONES UR: NEGATIVE mg/dL
Leukocytes, UA: NEGATIVE
NITRITE: NEGATIVE
PROTEIN: NEGATIVE mg/dL
Specific Gravity, Urine: 1.004 — ABNORMAL LOW (ref 1.005–1.030)
UROBILINOGEN UA: 0.2 mg/dL (ref 0.0–1.0)
pH: 5.5 (ref 5.0–8.0)

## 2013-12-30 LAB — BASIC METABOLIC PANEL
BUN: 7 mg/dL (ref 6–23)
CHLORIDE: 98 meq/L (ref 96–112)
CO2: 28 meq/L (ref 19–32)
CREATININE: 0.7 mg/dL (ref 0.50–1.35)
Calcium: 9.8 mg/dL (ref 8.4–10.5)
GFR calc Af Amer: >90 mL/min (ref >90)
GFR calc non Af Amer: >90 mL/min (ref >90)
Glucose, Bld: 100 mg/dL — ABNORMAL HIGH (ref 70–99)
Potassium: 4.4 mEq/L (ref 3.7–5.3)
Sodium: 138 mEq/L (ref 137–147)

## 2013-12-30 LAB — CBC
HEMATOCRIT: 49.7 % (ref 39.0–52.0)
Hemoglobin: 18.2 g/dL — ABNORMAL HIGH (ref 13.0–17.0)
MCH: 32.2 pg (ref 26.0–34.0)
MCHC: 36.6 g/dL — AB (ref 30.0–36.0)
MCV: 88 fL (ref 78.0–100.0)
PLATELETS: 197 10*3/uL (ref 150–400)
RBC: 5.65 MIL/uL (ref 4.22–5.81)
RDW: 12.7 % (ref 11.5–15.5)
WBC: 6.2 10*3/uL (ref 4.0–10.5)

## 2013-12-30 LAB — PROTIME-INR
INR: 1.04 (ref 0.00–1.49)
PROTHROMBIN TIME: 13.4 s (ref 11.6–15.2)

## 2013-12-30 LAB — SURGICAL PCR SCREEN
MRSA, PCR: NEGATIVE
Staphylococcus aureus: NEGATIVE

## 2013-12-30 LAB — APTT: aPTT: 32 seconds (ref 24–37)

## 2013-12-30 NOTE — Progress Notes (Signed)
Patients bp elevated at pre op 195/127, pt has no primary care md.

## 2013-12-30 NOTE — Progress Notes (Signed)
12/30/13 0958  OBSTRUCTIVE SLEEP APNEA  Have you ever been diagnosed with sleep apnea through a sleep study? No  Do you snore loudly (loud enough to be heard through closed doors)?  1  Do you often feel tired, fatigued, or sleepy during the daytime? 1  Has anyone observed you stop breathing during your sleep? 1  Do you have, or are you being treated for high blood pressure? 1  BMI more than 35 kg/m2? 0  Age over 284 years old? 0  Neck circumference greater than 40 cm/18 inches? 0  Gender: 1  Obstructive Sleep Apnea Score 5  Score 4 or greater  Results sent to PCP

## 2014-01-03 ENCOUNTER — Telehealth: Payer: Self-pay | Admitting: Family Medicine

## 2014-01-03 NOTE — Telephone Encounter (Signed)
Pt needs to be soon this week for B/P and is a new pt. Advised we aren't taking new pts at this time. If needs appt this week will need to make appt at different office. Wife verbalized understanding.

## 2014-01-07 ENCOUNTER — Encounter (HOSPITAL_COMMUNITY): Admission: RE | Payer: Self-pay | Source: Ambulatory Visit

## 2014-01-07 ENCOUNTER — Inpatient Hospital Stay (HOSPITAL_COMMUNITY): Admission: RE | Admit: 2014-01-07 | Payer: Medicaid Other | Source: Ambulatory Visit | Admitting: Orthopaedic Surgery

## 2014-01-07 SURGERY — ARTHROPLASTY, HIP, TOTAL, ANTERIOR APPROACH
Anesthesia: Choice | Site: Hip | Laterality: Right

## 2014-01-24 ENCOUNTER — Other Ambulatory Visit (HOSPITAL_COMMUNITY): Payer: Self-pay | Admitting: Orthopaedic Surgery

## 2014-01-26 ENCOUNTER — Encounter (HOSPITAL_COMMUNITY): Payer: Self-pay

## 2014-01-31 ENCOUNTER — Other Ambulatory Visit (HOSPITAL_COMMUNITY): Payer: Self-pay | Admitting: *Deleted

## 2014-01-31 NOTE — Pre-Procedure Instructions (Signed)
Troy Hunt  01/31/2014   Your procedure is scheduled on:  Tuesday, Feb 08, 2014 at 1:15 PM.   Report [facility] En[facility] "A" Admitting Office at 11:15 AM.   Call this number if you have problems the morning of surgery: [phone]   Remember:   Do not eat food or drink liquids after midnight Monday, 02/07/14.   Take these medicines the morning of surgery with A SIP OF WATER: cloNIDine (CATAPRES), oxyCODONE (OXY IR/ROXICODONE) - if needed.     Do not wear jewelry.  Do not wear lotions, powders, or cologne. You may wear deodorant.  Men may shave face and neck.  Do not bring valuables to the hospital.  [facility] is not responsible                  for any belongings or valuables.               Contacts, dentures or bridgework may not be worn into surgery.  Leave suitcase in the car. After surgery it may be brought to your room.  For patients admitted to the hospital, discharge time is determined by your                treatment team.              Special Instructions: [facility] - Preparing for Surgery  Before surgery, you can play an important role.  Because skin is not sterile, your skin needs to be as free of germs as possible.  You can reduce the number of germs on you skin by washing with CHG (chlorahexidine gluconate) soap before surgery.  CHG is an antiseptic cleaner which kills germs and bonds with the skin to continue killing germs even after washing.  Please DO NOT use if you have an allergy to CHG or antibacterial soaps.  If your skin becomes reddened/irritated stop using the CHG and inform your nurse when you arrive at Short Stay.  Do not shave (including legs and underarms) for at least 48 hours prior to the first CHG shower.  You may shave your face.  Please follow these instructions carefully:   1.  Shower with CHG Soap the night before surgery and the                                morning of Surgery.  2.  If you choose to wash your hair, wash  your hair first as usual with your       normal shampoo.  3.  After you shampoo, rinse your hair and body thoroughly to remove the                      Shampoo.  4.  Use CHG as you would any other liquid soap.  You can apply chg directly       to the skin and wash gently with scrungie or a clean washcloth.  5.  Apply the CHG Soap to your body ONLY FROM THE NECK DOWN.        Do not use on open wounds or open sores.  Avoid contact with your eyes, ears, mouth and genitals (private parts).  Wash genitals (private parts) with your normal soap.  6.  Wash thoroughly, paying special attention to the area where your surgery        will be performed.  7.  Thoroughly  rinse your body with warm water from the neck down.  8.  DO NOT shower/wash with your normal soap after using and rinsing off       the CHG Soap.  9.  Pat yourself dry with a clean towel.            10.  Wear clean pajamas.            11.  Place clean sheets on your bed the night of your first shower and do not        sleep with pets.  Day of Surgery  Do not apply any lotions the morning of surgery.  Please wear clean clothes to the hospital/surgery center.     Please read over the following fact sheets that you were given: Pain Booklet, Coughing and Deep Breathing, Blood Transfusion Information, MRSA Information and Surgical Site Infection Prevention

## 2014-02-01 ENCOUNTER — Encounter (HOSPITAL_COMMUNITY)
Admission: RE | Admit: 2014-02-01 | Discharge: 2014-02-01 | Disposition: A | Discharge status: Home / Self Care | Payer: Medicaid Other | Source: Ambulatory Visit | Attending: Anesthesiology | Admitting: Anesthesiology

## 2014-02-01 ENCOUNTER — Encounter (HOSPITAL_COMMUNITY)
Admission: RE | Admit: 2014-02-01 | Discharge: 2014-02-01 | Disposition: A | Discharge status: Home / Self Care | Payer: Medicaid Other | Source: Ambulatory Visit | Attending: Orthopaedic Surgery | Admitting: Orthopaedic Surgery

## 2014-02-01 ENCOUNTER — Other Ambulatory Visit (HOSPITAL_COMMUNITY): Payer: Self-pay | Admitting: Orthopaedic Surgery

## 2014-02-01 DIAGNOSIS — I10 Essential (primary) hypertension: Secondary | ICD-10-CM | POA: Insufficient documentation

## 2014-02-01 DIAGNOSIS — Z01818 Encounter for other preprocedural examination: Secondary | ICD-10-CM | POA: Insufficient documentation

## 2014-02-01 DIAGNOSIS — Z01812 Encounter for preprocedural laboratory examination: Secondary | ICD-10-CM | POA: Insufficient documentation

## 2014-02-01 LAB — BASIC METABOLIC PANEL
BUN: 5 mg/dL — AB (ref 6–23)
CHLORIDE: 100 meq/L (ref 96–112)
CO2: 23 mEq/L (ref 19–32)
Calcium: 8.9 mg/dL (ref 8.4–10.5)
Creatinine, Ser: 0.75 mg/dL (ref 0.50–1.35)
GFR calc non Af Amer: >90 mL/min (ref >90)
Glucose, Bld: 98 mg/dL (ref 70–99)
POTASSIUM: 3.5 meq/L — AB (ref 3.7–5.3)
Sodium: 139 mEq/L (ref 137–147)

## 2014-02-01 LAB — TYPE AND SCREEN
ABO/RH(D): O NEG
ANTIBODY SCREEN: NEGATIVE

## 2014-02-01 LAB — CBC
HEMATOCRIT: 48.8 % (ref 39.0–52.0)
HEMOGLOBIN: 12.7 g/dL — AB (ref 13.0–17.0)
MCH: 22.9 pg — ABNORMAL LOW (ref 26.0–34.0)
MCHC: 26 g/dL — AB (ref 30.0–36.0)
MCV: 87.9 fL (ref 78.0–100.0)
Platelets: 198 10*3/uL (ref 150–400)
RBC: 5.55 MIL/uL (ref 4.22–5.81)
RDW: 12.6 % (ref 11.5–15.5)
WBC: 5.2 10*3/uL (ref 4.0–10.5)

## 2014-02-01 LAB — URINALYSIS, ROUTINE W REFLEX MICROSCOPIC
Bilirubin Urine: NEGATIVE
Glucose, UA: NEGATIVE mg/dL
Hgb urine dipstick: NEGATIVE
Ketones, ur: NEGATIVE mg/dL
LEUKOCYTES UA: NEGATIVE
Nitrite: NEGATIVE
Protein, ur: NEGATIVE mg/dL
Specific Gravity, Urine: 1.011 (ref 1.005–1.030)
UROBILINOGEN UA: 0.2 mg/dL (ref 0.0–1.0)
pH: 5.5 (ref 5.0–8.0)

## 2014-02-01 LAB — PROTIME-INR
INR: 1.09 (ref 0.00–1.49)
Prothrombin Time: 13.9 seconds (ref 11.6–15.2)

## 2014-02-01 LAB — APTT: aPTT: 29 seconds (ref 24–37)

## 2014-02-01 LAB — SURGICAL PCR SCREEN
MRSA, PCR: NEGATIVE
STAPHYLOCOCCUS AUREUS: NEGATIVE

## 2014-02-01 NOTE — Progress Notes (Signed)
Pt keep record of blood pressures placed in chart during PAT

## 2014-02-01 NOTE — Progress Notes (Signed)
This patient scored at an elevated risk for obstructive sleep apnea using the STOP BANG tool during a pre surgical testing.

## 2014-02-07 MED ORDER — CEFAZOLIN SODIUM-DEXTROSE 2-3 GM-% IV SOLR
2.0000 g | INTRAVENOUS | Status: AC
  Administered 2014-02-08: 3 g via INTRAVENOUS
  Filled 2014-02-07: qty 50

## 2014-02-08 ENCOUNTER — Encounter (HOSPITAL_COMMUNITY): Payer: Self-pay | Admitting: *Deleted

## 2014-02-08 ENCOUNTER — Inpatient Hospital Stay (HOSPITAL_COMMUNITY): Payer: Medicaid Other | Admitting: Anesthesiology

## 2014-02-08 ENCOUNTER — Inpatient Hospital Stay (HOSPITAL_COMMUNITY): Payer: Medicaid Other

## 2014-02-08 ENCOUNTER — Inpatient Hospital Stay (HOSPITAL_COMMUNITY)
Admission: RE | Admit: 2014-02-08 | Discharge: 2014-02-11 | DRG: 470 | Disposition: A | Discharge status: Home Health | Payer: Medicaid Other | Source: Ambulatory Visit | Attending: Orthopaedic Surgery | Admitting: Orthopaedic Surgery

## 2014-02-08 ENCOUNTER — Encounter (HOSPITAL_COMMUNITY): Payer: Medicaid Other | Admitting: Anesthesiology

## 2014-02-08 ENCOUNTER — Encounter (HOSPITAL_COMMUNITY)
Admission: RE | Disposition: A | Discharge status: Home Health | Payer: Self-pay | Source: Ambulatory Visit | Attending: Orthopaedic Surgery

## 2014-02-08 DIAGNOSIS — K219 Gastro-esophageal reflux disease without esophagitis: Secondary | ICD-10-CM | POA: Diagnosis present

## 2014-02-08 DIAGNOSIS — M169 Osteoarthritis of hip, unspecified: Secondary | ICD-10-CM | POA: Diagnosis present

## 2014-02-08 DIAGNOSIS — M1610 Unilateral primary osteoarthritis, unspecified hip: Secondary | ICD-10-CM | POA: Diagnosis present

## 2014-02-08 DIAGNOSIS — M87051 Idiopathic aseptic necrosis of right femur: Secondary | ICD-10-CM

## 2014-02-08 DIAGNOSIS — I10 Essential (primary) hypertension: Secondary | ICD-10-CM | POA: Diagnosis present

## 2014-02-08 DIAGNOSIS — Z79899 Other long term (current) drug therapy: Secondary | ICD-10-CM

## 2014-02-08 DIAGNOSIS — Z96649 Presence of unspecified artificial hip joint: Secondary | ICD-10-CM

## 2014-02-08 DIAGNOSIS — M87059 Idiopathic aseptic necrosis of unspecified femur: Principal | ICD-10-CM | POA: Diagnosis present

## 2014-02-08 DIAGNOSIS — Z888 Allergy status to other drugs, medicaments and biological substances status: Secondary | ICD-10-CM

## 2014-02-08 DIAGNOSIS — Z7982 Long term (current) use of aspirin: Secondary | ICD-10-CM

## 2014-02-08 DIAGNOSIS — R110 Nausea: Secondary | ICD-10-CM | POA: Diagnosis not present

## 2014-02-08 HISTORY — PX: TOTAL HIP ARTHROPLASTY: SHX124

## 2014-02-08 SURGERY — ARTHROPLASTY, HIP, TOTAL, ANTERIOR APPROACH
Anesthesia: General | Site: Hip | Laterality: Right

## 2014-02-08 MED ORDER — DIPHENHYDRAMINE HCL 12.5 MG/5ML PO ELIX
12.5000 mg | ORAL_SOLUTION | ORAL | Status: DC | PRN
Start: 2014-02-08 — End: 2014-02-11

## 2014-02-08 MED ORDER — PHENOL 1.4 % MT LIQD: 1.0000 | OROMUCOSAL | Status: DC | PRN

## 2014-02-08 MED ORDER — FERROUS SULFATE 325 (65 FE) MG PO TABS
325.0000 mg | ORAL_TABLET | Freq: Three times a day (TID) | ORAL | Status: DC
  Administered 2014-02-08 – 2014-02-10 (×3): 325 mg via ORAL
  Filled 2014-02-08 (×8): qty 1

## 2014-02-08 MED ORDER — CLONIDINE HCL 0.1 MG PO TABS
0.1000 mg | ORAL_TABLET | Freq: Every day | ORAL | Status: DC
  Administered 2014-02-09 – 2014-02-11 (×3): 0.1 mg via ORAL
  Filled 2014-02-08 (×3): qty 1

## 2014-02-08 MED ORDER — NEOSTIGMINE METHYLSULFATE 10 MG/10ML IV SOLN
INTRAVENOUS | Status: DC | PRN
  Administered 2014-02-08: 3 mg via INTRAVENOUS

## 2014-02-08 MED ORDER — FENTANYL CITRATE 0.05 MG/ML IJ SOLN
50.0000 ug | INTRAMUSCULAR | Status: DC | PRN
  Administered 2014-02-08: 100 ug via INTRAVENOUS

## 2014-02-08 MED ORDER — MENTHOL 3 MG MT LOZG: 1.0000 | LOZENGE | OROMUCOSAL | Status: DC | PRN

## 2014-02-08 MED ORDER — CEFAZOLIN SODIUM 1-5 GM-% IV SOLN
1.0000 g | Freq: Four times a day (QID) | INTRAVENOUS | Status: AC
  Administered 2014-02-08 – 2014-02-09 (×2): 1 g via INTRAVENOUS
  Filled 2014-02-08 (×2): qty 50

## 2014-02-08 MED ORDER — HYDROMORPHONE HCL PF 1 MG/ML IJ SOLN
1.0000 mg | INTRAMUSCULAR | Status: DC | PRN
  Administered 2014-02-08 – 2014-02-10 (×15): 1 mg via INTRAVENOUS
  Filled 2014-02-08 (×15): qty 1

## 2014-02-08 MED ORDER — MIDAZOLAM HCL 5 MG/5ML IJ SOLN
INTRAMUSCULAR | Status: DC | PRN
  Administered 2014-02-08: 2 mg via INTRAVENOUS

## 2014-02-08 MED ORDER — METHOCARBAMOL 500 MG PO TABS
500.0000 mg | ORAL_TABLET | Freq: Four times a day (QID) | ORAL | Status: DC | PRN
  Administered 2014-02-09 – 2014-02-11 (×9): 500 mg via ORAL
  Filled 2014-02-08 (×11): qty 1

## 2014-02-08 MED ORDER — LIDOCAINE HCL 4 % MT SOLN
OROMUCOSAL | Status: DC | PRN
  Administered 2014-02-08: 4 mL via TOPICAL

## 2014-02-08 MED ORDER — METHOCARBAMOL 1000 MG/10ML IJ SOLN
500.0000 mg | Freq: Four times a day (QID) | INTRAVENOUS | Status: DC | PRN
  Filled 2014-02-08: qty 5

## 2014-02-08 MED ORDER — ONDANSETRON HCL 4 MG/2ML IJ SOLN: 4.0000 mg | Freq: Once | INTRAMUSCULAR | Status: DC | PRN

## 2014-02-08 MED ORDER — METOCLOPRAMIDE HCL 10 MG PO TABS
5.0000 mg | ORAL_TABLET | Freq: Three times a day (TID) | ORAL | Status: DC | PRN
  Administered 2014-02-09: 10 mg via ORAL
  Filled 2014-02-08: qty 1

## 2014-02-08 MED ORDER — ONDANSETRON HCL 4 MG/2ML IJ SOLN
4.0000 mg | Freq: Four times a day (QID) | INTRAMUSCULAR | Status: DC | PRN
  Administered 2014-02-09: 4 mg via INTRAVENOUS
  Filled 2014-02-08: qty 2

## 2014-02-08 MED ORDER — FENTANYL CITRATE 0.05 MG/ML IJ SOLN
INTRAMUSCULAR | Status: AC
  Filled 2014-02-08: qty 5

## 2014-02-08 MED ORDER — 0.9 % SODIUM CHLORIDE (POUR BTL) OPTIME
TOPICAL | Status: DC | PRN
  Administered 2014-02-08: 1000 mL

## 2014-02-08 MED ORDER — ALBUMIN HUMAN 5 % IV SOLN
INTRAVENOUS | Status: DC | PRN
  Administered 2014-02-08: 16:00:00 via INTRAVENOUS

## 2014-02-08 MED ORDER — LACTATED RINGERS IV SOLN
INTRAVENOUS | Status: DC
  Administered 2014-02-08: 11:00:00 via INTRAVENOUS

## 2014-02-08 MED ORDER — OXYCODONE HCL 5 MG PO TABS
15.0000 mg | ORAL_TABLET | ORAL | Status: DC | PRN
  Administered 2014-02-08 (×2): 15 mg via ORAL
  Administered 2014-02-09: 10 mg via ORAL
  Administered 2014-02-09 (×2): 15 mg via ORAL
  Administered 2014-02-09: 5 mg via ORAL
  Administered 2014-02-09 – 2014-02-11 (×17): 15 mg via ORAL
  Filled 2014-02-08 (×27): qty 3

## 2014-02-08 MED ORDER — TRANEXAMIC ACID 100 MG/ML IV SOLN
1000.0000 mg | INTRAVENOUS | Status: AC
  Administered 2014-02-08: 1000 mg via INTRAVENOUS
  Filled 2014-02-08: qty 10

## 2014-02-08 MED ORDER — METHOCARBAMOL 1000 MG/10ML IJ SOLN
500.0000 mg | INTRAVENOUS | Status: AC
  Administered 2014-02-08 (×2): 500 mg via INTRAVENOUS
  Filled 2014-02-08: qty 5

## 2014-02-08 MED ORDER — SODIUM CHLORIDE 0.9 % IV SOLN
INTRAVENOUS | Status: DC
  Administered 2014-02-08: 75 mL/h via INTRAVENOUS

## 2014-02-08 MED ORDER — ONDANSETRON HCL 4 MG/2ML IJ SOLN
INTRAMUSCULAR | Status: DC | PRN
  Administered 2014-02-08: 4 mg via INTRAVENOUS

## 2014-02-08 MED ORDER — ROCURONIUM BROMIDE 100 MG/10ML IV SOLN
INTRAVENOUS | Status: DC | PRN
  Administered 2014-02-08: 10 mg via INTRAVENOUS
  Administered 2014-02-08: 5 mg via INTRAVENOUS
  Administered 2014-02-08: 10 mg via INTRAVENOUS
  Administered 2014-02-08: 50 mg via INTRAVENOUS
  Administered 2014-02-08: 5 mg via INTRAVENOUS
  Administered 2014-02-08: 10 mg via INTRAVENOUS
  Administered 2014-02-08: 5 mg via INTRAVENOUS

## 2014-02-08 MED ORDER — PANTOPRAZOLE SODIUM 40 MG PO TBEC
40.0000 mg | DELAYED_RELEASE_TABLET | Freq: Every day | ORAL | Status: DC
  Administered 2014-02-08 – 2014-02-11 (×4): 40 mg via ORAL
  Filled 2014-02-08 (×4): qty 1

## 2014-02-08 MED ORDER — ALUM & MAG HYDROXIDE-SIMETH 200-200-20 MG/5ML PO SUSP: 30.0000 mL | ORAL | Status: DC | PRN

## 2014-02-08 MED ORDER — DOCUSATE SODIUM 100 MG PO CAPS
100.0000 mg | ORAL_CAPSULE | Freq: Two times a day (BID) | ORAL | Status: DC
  Administered 2014-02-08 – 2014-02-11 (×6): 100 mg via ORAL
  Filled 2014-02-08 (×8): qty 1

## 2014-02-08 MED ORDER — FENTANYL CITRATE 0.05 MG/ML IJ SOLN
INTRAMUSCULAR | Status: DC | PRN
  Administered 2014-02-08 (×2): 100 ug via INTRAVENOUS
  Administered 2014-02-08 (×2): 50 ug via INTRAVENOUS
  Administered 2014-02-08: 100 ug via INTRAVENOUS
  Administered 2014-02-08 (×2): 50 ug via INTRAVENOUS

## 2014-02-08 MED ORDER — FENTANYL CITRATE 0.05 MG/ML IJ SOLN
INTRAMUSCULAR | Status: AC
  Administered 2014-02-08: 100 ug via INTRAVENOUS
  Filled 2014-02-08: qty 2

## 2014-02-08 MED ORDER — PROPOFOL 10 MG/ML IV BOLUS
INTRAVENOUS | Status: AC
  Filled 2014-02-08: qty 20

## 2014-02-08 MED ORDER — PHENYLEPHRINE HCL 10 MG/ML IJ SOLN
INTRAMUSCULAR | Status: DC | PRN
  Administered 2014-02-08 (×2): 80 ug via INTRAVENOUS

## 2014-02-08 MED ORDER — POLYETHYLENE GLYCOL 3350 17 G PO PACK
17.0000 g | PACK | Freq: Every day | ORAL | Status: DC | PRN
  Administered 2014-02-09 – 2014-02-10 (×2): 17 g via ORAL
  Filled 2014-02-08 (×2): qty 1

## 2014-02-08 MED ORDER — HYDROMORPHONE HCL PF 1 MG/ML IJ SOLN
INTRAMUSCULAR | Status: AC
  Filled 2014-02-08: qty 1

## 2014-02-08 MED ORDER — ASPIRIN EC 325 MG PO TBEC
325.0000 mg | DELAYED_RELEASE_TABLET | Freq: Two times a day (BID) | ORAL | Status: DC
  Administered 2014-02-08 – 2014-02-11 (×6): 325 mg via ORAL
  Filled 2014-02-08 (×8): qty 1

## 2014-02-08 MED ORDER — LACTATED RINGERS IV SOLN
INTRAVENOUS | Status: DC | PRN
  Administered 2014-02-08 (×3): via INTRAVENOUS

## 2014-02-08 MED ORDER — ONDANSETRON HCL 4 MG PO TABS
4.0000 mg | ORAL_TABLET | Freq: Four times a day (QID) | ORAL | Status: DC | PRN
  Administered 2014-02-10: 4 mg via ORAL
  Filled 2014-02-08: qty 1

## 2014-02-08 MED ORDER — HYDROMORPHONE HCL PF 1 MG/ML IJ SOLN
0.2500 mg | INTRAMUSCULAR | Status: DC | PRN
  Administered 2014-02-08 (×4): 0.5 mg via INTRAVENOUS

## 2014-02-08 MED ORDER — MIDAZOLAM HCL 2 MG/2ML IJ SOLN
INTRAMUSCULAR | Status: AC
  Filled 2014-02-08: qty 2

## 2014-02-08 MED ORDER — ROCURONIUM BROMIDE 50 MG/5ML IV SOLN
INTRAVENOUS | Status: AC
  Filled 2014-02-08: qty 1

## 2014-02-08 MED ORDER — SORBITOL 70 % SOLN
30.0000 mL | Freq: Every day | Status: DC | PRN
Start: 2014-02-08 — End: 2014-02-11
  Administered 2014-02-10: 30 mL via ORAL
  Filled 2014-02-08: qty 30

## 2014-02-08 MED ORDER — LIDOCAINE HCL (CARDIAC) 20 MG/ML IV SOLN
INTRAVENOUS | Status: DC | PRN
  Administered 2014-02-08: 60 mg via INTRAVENOUS

## 2014-02-08 MED ORDER — PROPOFOL 10 MG/ML IV BOLUS
INTRAVENOUS | Status: DC | PRN
  Administered 2014-02-08: 200 mg via INTRAVENOUS

## 2014-02-08 MED ORDER — OXYCODONE HCL ER 20 MG PO T12A
40.0000 mg | EXTENDED_RELEASE_TABLET | Freq: Two times a day (BID) | ORAL | Status: DC
  Administered 2014-02-08 – 2014-02-10 (×4): 40 mg via ORAL
  Filled 2014-02-08 (×4): qty 2

## 2014-02-08 MED ORDER — DEXAMETHASONE SODIUM PHOSPHATE 4 MG/ML IJ SOLN
INTRAMUSCULAR | Status: DC | PRN
  Administered 2014-02-08: 8 mg via INTRAVENOUS

## 2014-02-08 MED ORDER — HYDROMORPHONE HCL PF 1 MG/ML IJ SOLN
INTRAMUSCULAR | Status: DC | PRN
  Administered 2014-02-08: 1 mg via INTRAVENOUS

## 2014-02-08 MED ORDER — SODIUM CHLORIDE 0.9 % IR SOLN
Status: DC | PRN
  Administered 2014-02-08: 3000 mL

## 2014-02-08 MED ORDER — DEXAMETHASONE SODIUM PHOSPHATE 4 MG/ML IJ SOLN
INTRAMUSCULAR | Status: AC
  Filled 2014-02-08: qty 2

## 2014-02-08 MED ORDER — GLYCOPYRROLATE 0.2 MG/ML IJ SOLN
INTRAMUSCULAR | Status: DC | PRN
  Administered 2014-02-08: 0.4 mg via INTRAVENOUS

## 2014-02-08 MED ORDER — METOCLOPRAMIDE HCL 5 MG/ML IJ SOLN: 5.0000 mg | Freq: Three times a day (TID) | INTRAMUSCULAR | Status: DC | PRN

## 2014-02-08 SURGICAL SUPPLY — 58 items
BALL HIP CERAMIC (Hips) ×1 IMPLANT
BANDAGE GAUZE ELAST BULKY 4 IN (GAUZE/BANDAGES/DRESSINGS) IMPLANT
BENZOIN TINCTURE PRP APPL 2/3 (GAUZE/BANDAGES/DRESSINGS) ×3 IMPLANT
BLADE SAW SGTL 18X1.27X75 (BLADE) ×2 IMPLANT
BLADE SAW SGTL 18X1.27X75MM (BLADE) ×1
BLADE SURG ROTATE 9660 (MISCELLANEOUS) IMPLANT
CAPT HIP PF COP ×3 IMPLANT
CELLS DAT CNTRL 66122 CELL SVR (MISCELLANEOUS) ×1 IMPLANT
CLEANER TIP ELECTROSURG 2X2 (MISCELLANEOUS) ×3 IMPLANT
CLOSURE STERI-STRIP 1/2X4 (GAUZE/BANDAGES/DRESSINGS) ×1
CLOSURE WOUND 1/2 X4 (GAUZE/BANDAGES/DRESSINGS) ×2
CLSR STERI-STRIP ANTIMIC 1/2X4 (GAUZE/BANDAGES/DRESSINGS) ×2 IMPLANT
COVER SURGICAL LIGHT HANDLE (MISCELLANEOUS) ×3 IMPLANT
DRAPE C-ARM 42X72 X-RAY (DRAPES) ×3 IMPLANT
DRAPE INCISE IOBAN 66X45 STRL (DRAPES) ×3 IMPLANT
DRAPE STERI IOBAN 125X83 (DRAPES) ×3 IMPLANT
DRAPE U-SHAPE 47X51 STRL (DRAPES) ×9 IMPLANT
DRSG AQUACEL AG ADV 3.5X10 (GAUZE/BANDAGES/DRESSINGS) ×3 IMPLANT
DURAPREP 26ML APPLICATOR (WOUND CARE) ×3 IMPLANT
ELECT BLADE 4.0 EZ CLEAN MEGAD (MISCELLANEOUS) ×3
ELECT BLADE 6.5 EXT (BLADE) IMPLANT
ELECT CAUTERY BLADE 6.4 (BLADE) ×3 IMPLANT
ELECT REM PT RETURN 9FT ADLT (ELECTROSURGICAL) ×3
ELECTRODE BLDE 4.0 EZ CLN MEGD (MISCELLANEOUS) ×1 IMPLANT
ELECTRODE REM PT RTRN 9FT ADLT (ELECTROSURGICAL) ×1 IMPLANT
FACESHIELD WRAPAROUND (MASK) ×6 IMPLANT
GLOVE BIOGEL PI IND STRL 8 (GLOVE) ×2 IMPLANT
GLOVE BIOGEL PI INDICATOR 8 (GLOVE) ×4
GLOVE ECLIPSE 8.0 STRL XLNG CF (GLOVE) ×3 IMPLANT
GLOVE ORTHO TXT STRL SZ7.5 (GLOVE) ×6 IMPLANT
GOWN STRL REUS W/ TWL XL LVL3 (GOWN DISPOSABLE) ×2 IMPLANT
GOWN STRL REUS W/TWL XL LVL3 (GOWN DISPOSABLE) ×4
HANDPIECE INTERPULSE COAX TIP (DISPOSABLE) ×2
HIP BALL CERAMIC (Hips) ×3 IMPLANT
KIT BASIN OR (CUSTOM PROCEDURE TRAY) ×3 IMPLANT
KIT ROOM TURNOVER OR (KITS) ×3 IMPLANT
MANIFOLD NEPTUNE II (INSTRUMENTS) ×3 IMPLANT
NS IRRIG 1000ML POUR BTL (IV SOLUTION) ×3 IMPLANT
PACK TOTAL JOINT (CUSTOM PROCEDURE TRAY) ×3 IMPLANT
PAD ARMBOARD 7.5X6 YLW CONV (MISCELLANEOUS) ×6 IMPLANT
RTRCTR WOUND ALEXIS 18CM MED (MISCELLANEOUS) ×3
SET HNDPC FAN SPRY TIP SCT (DISPOSABLE) ×1 IMPLANT
SPONGE LAP 18X18 X RAY DECT (DISPOSABLE) IMPLANT
SPONGE LAP 4X18 X RAY DECT (DISPOSABLE) IMPLANT
STRIP CLOSURE SKIN 1/2X4 (GAUZE/BANDAGES/DRESSINGS) ×4 IMPLANT
SUT ETHIBOND NAB CT1 #1 30IN (SUTURE) ×3 IMPLANT
SUT ETHILON 3 0 PS 1 (SUTURE) ×3 IMPLANT
SUT MNCRL AB 4-0 PS2 18 (SUTURE) ×3 IMPLANT
SUT VIC AB 0 CT1 27 (SUTURE) ×2
SUT VIC AB 0 CT1 27XBRD ANBCTR (SUTURE) ×1 IMPLANT
SUT VIC AB 1 CT1 27 (SUTURE) ×2
SUT VIC AB 1 CT1 27XBRD ANBCTR (SUTURE) ×1 IMPLANT
SUT VIC AB 2-0 CT1 27 (SUTURE) ×6
SUT VIC AB 2-0 CT1 TAPERPNT 27 (SUTURE) ×3 IMPLANT
TOWEL OR 17X24 6PK STRL BLUE (TOWEL DISPOSABLE) ×3 IMPLANT
TOWEL OR 17X26 10 PK STRL BLUE (TOWEL DISPOSABLE) ×3 IMPLANT
TRAY FOLEY CATH 16FRSI W/METER (SET/KITS/TRAYS/PACK) IMPLANT
WATER STERILE IRR 1000ML POUR (IV SOLUTION) IMPLANT

## 2014-02-08 NOTE — Anesthesia Preprocedure Evaluation (Addendum)
Anesthesia Evaluation  Patient identified by MRN, date of birth, ID band Patient awake    Reviewed: Allergy & Precautions, H&P , NPO status   History of Anesthesia Complications Negative for: history of anesthetic complications  Airway Mallampati: II TM Distance: >3 FB     Dental  (+) Poor Dentition, Dental Advisory Given, Missing   Pulmonary neg pulmonary ROS,          Cardiovascular hypertension, Pt. on medications     Neuro/Psych negative neurological ROS     GI/Hepatic negative GI ROS, Neg liver ROS, GERD-  Poorly Controlled,  Endo/Other  negative endocrine ROS  Renal/GU negative Renal ROS     Musculoskeletal   Abdominal   Peds  Hematology   Anesthesia Other Findings   Reproductive/Obstetrics                         Anesthesia Physical Anesthesia Plan  ASA: II  Anesthesia Plan: General   Post-op Pain Management:    Induction: Intravenous  Airway Management Planned: Oral ETT  Additional Equipment:   Intra-op Plan:   Post-operative Plan: Extubation in OR  Informed Consent: I have reviewed the patients History and Physical, chart, labs and discussed the procedure including the risks, benefits and alternatives for the proposed anesthesia with the patient or authorized representative who has indicated his/her understanding and acceptance.     Plan Discussed with:   Anesthesia Plan Comments:         Anesthesia Quick Evaluation
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
## 2014-02-08 NOTE — Brief Op Note (Signed)
02/08/2014  4:01 PM  PATIENT:  Troy Hunt  43 y.o. male  PRE-OPERATIVE DIAGNOSIS:  Avascular necrosis right hip  POST-OPERATIVE DIAGNOSIS:  Avascular necrosis right hip  PROCEDURE:  Procedure(s): RIGHT TOTAL HIP ARTHROPLASTY ANTERIOR APPROACH (Right)  SURGEON:  Surgeon(s) and Role:    * Kathryne Hitch, MD - Primary  PHYSICIAN ASSISTANT: Rexene Edison, PA-C  ANESTHESIA:   general  EBL:  Total I/O In: 2250 [I.V.:2000; IV Piggyback:250] Out: 1050 [Blood:1050]  BLOOD ADMINISTERED:none  DRAINS: none   LOCAL MEDICATIONS USED:  NONE  SPECIMEN:  No Specimen  DISPOSITION OF SPECIMEN:  N/A  COUNTS:  YES  TOURNIQUET:  * No tourniquets in log *  DICTATION: .Other Dictation: Dictation Number [id number]  PLAN OF CARE: Admit to inpatient   PATIENT DISPOSITION:  PACU - hemodynamically stable.   Delay start of Pharmacological VTE agent (>24hrs) due to surgical blood loss or risk of bleeding: no

## 2014-02-08 NOTE — Progress Notes (Signed)
Orthopedic Tech Progress Note Patient Details:  Troy Hunt 02-Oct-1970 [id number]  Ortho Devices Ortho Device/Splint Location: put ohf on bed   Jennye Moccasin 02/08/2014, 7:23 PM

## 2014-02-08 NOTE — Transfer of Care (Signed)
Immediate Anesthesia Transfer of Care Note  Patient: Troy Hunt  Procedure(s) Performed: Procedure(s): RIGHT TOTAL HIP ARTHROPLASTY ANTERIOR APPROACH (Right)  Patient Location: PACU  Anesthesia Type:General  Level of Consciousness: awake, alert , oriented and patient cooperative  Airway & Oxygen Therapy: Patient Spontanous Breathing and Patient connected to nasal cannula oxygen  Post-op Assessment: Report given to PACU RN and Post -op Vital signs reviewed and stable  Post vital signs: Reviewed and stable  Complications: No apparent anesthesia complications

## 2014-02-09 LAB — BASIC METABOLIC PANEL
BUN: 7 mg/dL (ref 6–23)
CO2: 25 mEq/L (ref 19–32)
CREATININE: 0.74 mg/dL (ref 0.50–1.35)
Calcium: 9 mg/dL (ref 8.4–10.5)
Chloride: 100 mEq/L (ref 96–112)
GFR calc Af Amer: >90 mL/min (ref >90)
GFR calc non Af Amer: >90 mL/min (ref >90)
Glucose, Bld: 126 mg/dL — ABNORMAL HIGH (ref 70–99)
Potassium: 4.9 mEq/L (ref 3.7–5.3)
Sodium: 137 mEq/L (ref 137–147)

## 2014-02-09 LAB — CBC
HCT: 40 % (ref 39.0–52.0)
Hemoglobin: 14.1 g/dL (ref 13.0–17.0)
MCH: 31.5 pg (ref 26.0–34.0)
MCHC: 35.3 g/dL (ref 30.0–36.0)
MCV: 89.5 fL (ref 78.0–100.0)
PLATELETS: 203 10*3/uL (ref 150–400)
RBC: 4.47 MIL/uL (ref 4.22–5.81)
RDW: 12.3 % (ref 11.5–15.5)
WBC: 12.8 10*3/uL — ABNORMAL HIGH (ref 4.0–10.5)

## 2014-02-09 MED ORDER — CEFAZOLIN SODIUM 1-5 GM-% IV SOLN
1.0000 g | Freq: Once | INTRAVENOUS | Status: AC
  Administered 2014-02-09: 1 g via INTRAVENOUS
  Filled 2014-02-09: qty 50

## 2014-02-09 NOTE — Progress Notes (Signed)
Subjective: 1 Day Post-Op Procedure(s) (LRB): RIGHT TOTAL HIP ARTHROPLASTY ANTERIOR APPROACH (Right) Patient reports pain as moderate.    Objective: Vital signs in last 24 hours: Temp:  [97.3 F (36.3 C)-98 F (36.7 C)] 98 F (36.7 C) (05/27 0629) Pulse Rate:  [58-89] 82 (05/27 0629) Resp:  [10-19] 16 (05/27 0629) BP: (117-174)/(71-109) 117/71 mmHg (05/27 0629) SpO2:  [96 %-100 %] 97 % (05/27 0629) Weight:  [84.369 kg (186 lb)] 84.369 kg (186 lb) (05/26 1108)  Intake/Output from previous day: 05/26 0701 - 05/27 0700 In: 5060 [P.O.:2200; I.V.:2500; IV Piggyback:360] Out: 2975 [Urine:1925; Blood:1050] Intake/Output this shift:     Recent Labs  02/09/14 0525  HGB 14.1    Recent Labs  02/09/14 0525  WBC 12.8*  RBC 4.47  HCT 40.0  PLT 203   No results found for this basename: NA, K, CL, CO2, BUN, CREATININE, GLUCOSE, CALCIUM,  in the last 72 hours No results found for this basename: LABPT, INR,  in the last 72 hours  Sensation intact distally Intact pulses distally Dorsiflexion/Plantar flexion intact Incision: dressing C/D/I  Assessment/Plan: 1 Day Post-Op Procedure(s) (LRB): RIGHT TOTAL HIP ARTHROPLASTY ANTERIOR APPROACH (Right) Up with therapy  Kathryne Hitch 02/09/2014, 7:19 AM

## 2014-02-09 NOTE — Progress Notes (Signed)
Utilization review completed.  

## 2014-02-09 NOTE — Progress Notes (Signed)
PT Cancellation Note  Patient Details Name: Troy Hunt MRN: [id number] DOB: 23-Aug-1971   Cancelled Treatment:    Reason Eval/Treat Not Completed: Medical issues which prohibited therapy.  Pt nauseated this pm.  Reviewed bed there ex for pt to try later and will f/u tomorrow.     Alison Murray Rosmary Dionisio 02/09/2014, 2:03 PM
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
## 2014-02-09 NOTE — Plan of Care (Signed)
Problem: Consults Goal: Diagnosis- Total Joint Replacement Outcome: Completed/Met Date Met:  02/09/14 Primary Total Hip
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
## 2014-02-09 NOTE — Care Management Note (Signed)
CARE MANAGEMENT NOTE 02/09/2014  Patient:  Troy Hunt, Troy Hunt   Account Number:  [id number]  Date Initiated:  02/09/2014  Documentation initiated by:  Vance Peper  Subjective/Objective Assessment:   43 yr old male admitted with avascular necrosis of right hip, s/p right total hip arthroplasty.     Action/Plan:   Case manager spoke with patient concerning home health needs and DME. Referral called to [facility]. Patient has rolling walker and 3in1.   Anticipated DC Date:  02/10/2014   Anticipated DC Plan:  HOME W HOME HEALTH SERVICES      DC Planning Services  CM consult      [facility] Choice  HOME HEALTH   Choice offered to / List presented to:  C-1 Patient   DME arranged  NA        HH arranged  HH-2 PT      HH agency  [facility]   Status of service:  Completed, signed off Medicare Important Message given?   (If response is "NO", the following Medicare IM given date fields will be blank) Date Medicare IM given:   Date Additional Medicare IM given:    Discharge Disposition:  HOME W HOME HEALTH SERVICES  Per UR Regulation:  Reviewed for med. necessity/level of care/duration of stay

## 2014-02-10 ENCOUNTER — Encounter (HOSPITAL_COMMUNITY): Payer: Self-pay | Admitting: Orthopaedic Surgery

## 2014-02-10 LAB — CBC
HEMATOCRIT: 35.2 % — AB (ref 39.0–52.0)
Hemoglobin: 12.3 g/dL — ABNORMAL LOW (ref 13.0–17.0)
MCH: 32 pg (ref 26.0–34.0)
MCHC: 34.9 g/dL (ref 30.0–36.0)
MCV: 91.7 fL (ref 78.0–100.0)
Platelets: 150 10*3/uL (ref 150–400)
RBC: 3.84 MIL/uL — AB (ref 4.22–5.81)
RDW: 12.8 % (ref 11.5–15.5)
WBC: 9.7 10*3/uL (ref 4.0–10.5)

## 2014-02-10 NOTE — Significant Event (Signed)
Discussed with Chestine Spore PA & Dr. Magnus Ivan, redness and tenderness around incision.  No new orders at this time, will continue to monitor closely.  Recommend K-Pad for comfort.

## 2014-02-10 NOTE — Anesthesia Postprocedure Evaluation (Signed)
Anesthesia Post Note  Patient: Troy Hunt  Procedure(s) Performed: Procedure(s) (LRB): RIGHT TOTAL HIP ARTHROPLASTY ANTERIOR APPROACH (Right)  Anesthesia type: general  Patient location: PACU  Post pain: Pain level controlled  Post assessment: Patient's Cardiovascular Status Stable  Post vital signs: Reviewed and stable  Level of consciousness: sedated  Complications: No apparent anesthesia complications

## 2014-02-10 NOTE — Progress Notes (Signed)
Subjective: 2 Days Post-Op Procedure(s) (LRB): RIGHT TOTAL HIP ARTHROPLASTY ANTERIOR APPROACH (Right) Patient reports pain as moderate.  Some nausea today, otherwise progressing well.  Objective: Vital signs in last 24 hours: Temp:  [97.9 F (36.6 C)-99 F (37.2 C)] 99 F (37.2 C) (05/28 0540) Pulse Rate:  [86-92] 92 (05/28 0540) Resp:  [18] 18 (05/28 0540) BP: (113-139)/(69-84) 116/69 mmHg (05/28 0540) SpO2:  [98 %-100 %] 98 % (05/28 0540)  Intake/Output from previous day: 05/27 0701 - 05/28 0700 In: 1500 [P.O.:960; I.V.:540] Out: 1775 [Urine:1775] Intake/Output this shift:     Recent Labs  02/09/14 0525 02/10/14 0553  HGB 14.1 12.3*    Recent Labs  02/09/14 0525 02/10/14 0553  WBC 12.8* 9.7  RBC 4.47 3.84*  HCT 40.0 35.2*  PLT 203 150    Recent Labs  02/09/14 0525  NA 137  K 4.9  CL 100  CO2 25  BUN 7  CREATININE 0.74  GLUCOSE 126*  CALCIUM 9.0   No results found for this basename: LABPT, INR,  in the last 72 hours  Sensation intact distally Intact pulses distally Dorsiflexion/Plantar flexion intact Incision: dressing C/D/I Compartment soft  Assessment/Plan: 2 Days Post-Op Procedure(s) (LRB): RIGHT TOTAL HIP ARTHROPLASTY ANTERIOR APPROACH (Right) Up with therapy Plan d/c to home tomorrow with home health Hep lock IV kpad right hip area as needed  Troy Hunt 02/10/2014, 9:52 AM

## 2014-02-10 NOTE — Progress Notes (Signed)
Physical Therapy Treatment Patient Details Name: Troy Hunt MRN: [id number] DOB: 07-17-1971 Today's Date: 02/10/2014    History of Present Illness pt rpesents with R THA.      PT Comments    Pt moving well, only limited by nausea.  Encouraged pt to amb with wife and Nsg staff to A with having a BM and relieve abdominal pain.  RN made aware.  Will continue to follow.    Follow Up Recommendations  Home health PT;Supervision - Intermittent     Equipment Recommendations  None recommended by PT    Recommendations for Other Services       Precautions / Restrictions Precautions Precautions: Other (comment) Precaution Comments: Anterior Hip with MD writing in no excessive external rotation.   Restrictions Weight Bearing Restrictions: Yes RLE Weight Bearing: Weight bearing as tolerated    Mobility  Bed Mobility                  Transfers Overall transfer level: Needs assistance Equipment used: Rolling walker (2 wheeled) Transfers: Sit to/from Stand Sit to Stand: Supervision         General transfer comment: Demos good technique.    Ambulation/Gait Ambulation/Gait assistance: Supervision Ambulation Distance (Feet): 300 Feet Assistive device: Rolling walker (2 wheeled) Gait Pattern/deviations: Step-through pattern;Decreased stride length;Decreased step length - left;Decreased stance time - right     General Gait Details: cues for more fluid gait pattern and use of RW.     Stairs            Wheelchair Mobility    Modified Rankin (Stroke Patients Only)       Balance Overall balance assessment: Needs assistance         Standing balance support: No upper extremity supported Standing balance-Leahy Scale: Fair                      Cognition Arousal/Alertness: Awake/alert Behavior During Therapy: WFL for tasks assessed/performed Overall Cognitive Status: Within Functional Limits for tasks assessed                       Exercises Total Joint Exercises Ankle Circles/Pumps: AROM;Both;10 reps Quad Sets: AROM;Both;10 reps Long Arc Quad: AROM;Right;10 reps Marching in Standing: AROM;Right;5 reps (in sitting)    General Comments        Pertinent Vitals/Pain "Aches".  Premedicated.      Home Living                      Prior Function            PT Goals (current goals can now be found in the care plan section) Acute Rehab PT Goals PT Goal Formulation: With patient Time For Goal Achievement: 02/16/14 Potential to Achieve Goals: Good Progress towards PT goals: Progressing toward goals    Frequency  7X/week    PT Plan Current plan remains appropriate    Co-evaluation             End of Session Equipment Utilized During Treatment: Gait belt Activity Tolerance: Patient tolerated treatment well Patient left: in chair;with call bell/phone within reach     Time: 1027-1046 PT Time Calculation (min): 19 min  Charges:  $Gait Training: 8-22 mins                    G CodesSunny Hunt, [place] [phone] 02/10/2014, 1:41 PM

## 2014-02-10 NOTE — Progress Notes (Signed)
Physical Therapy Note   02/10/14 1500  PT Visit Information  Last PT Received On 02/10/14  Assistance Needed +1  History of Present Illness pt rpesents with R THA.    PT Time Calculation  PT Start Time 1344  PT Stop Time 1400  PT Time Calculation (min) 16 min  Precautions  Precautions Other (comment)  Precaution Comments Anterior Hip with MD writing in no excessive external rotation.    Restrictions  Weight Bearing Restrictions Yes  RLE Weight Bearing WBAT  Cognition  Arousal/Alertness Awake/alert  Behavior During Therapy WFL for tasks assessed/performed  Overall Cognitive Status Within Functional Limits for tasks assessed  Bed Mobility  Overal bed mobility Needs Assistance  Bed Mobility Supine to Sit;Sit to Supine  Supine to sit Min assist  Sit to supine Min assist  General bed mobility comments A with R LE only.    Transfers  Overall transfer level Needs assistance  Equipment used Rolling walker (2 wheeled)  Transfers Sit to/from Stand  Sit to Stand Supervision  General transfer comment Demos good technique.    Ambulation/Gait  Ambulation/Gait assistance Supervision  Ambulation Distance (Feet) 500 Feet  Assistive device Rolling walker (2 wheeled)  Gait Pattern/deviations Step-through pattern;Decreased step length - left;Decreased stance time - right  General Gait Details cues for more fluid gait pattern and use of RW.    PT - End of Session  Equipment Utilized During Treatment Gait belt  Activity Tolerance Patient tolerated treatment well  Patient left in chair;with call bell/phone within reach  Nurse Communication Mobility status  PT - Assessment/Plan  PT Plan Current plan remains appropriate  PT Frequency 7X/week  Follow Up Recommendations Home health PT;Supervision - Intermittent  PT equipment None recommended by PT  PT Goal Progression  Progress towards PT goals Progressing toward goals  Acute Rehab PT Goals  PT Goal Formulation With patient  Time For Goal  Achievement 02/16/14  Potential to Achieve Goals Good  PT General Charges  $$ ACUTE PT VISIT 1 Procedure  PT Treatments  $Gait Training 8-22 mins   [place], PT [phone]

## 2014-02-11 LAB — CBC
HEMATOCRIT: 32 % — AB (ref 39.0–52.0)
HEMOGLOBIN: 11.2 g/dL — AB (ref 13.0–17.0)
MCH: 31.7 pg (ref 26.0–34.0)
MCHC: 35 g/dL (ref 30.0–36.0)
MCV: 90.7 fL (ref 78.0–100.0)
Platelets: 146 10*3/uL — ABNORMAL LOW (ref 150–400)
RBC: 3.53 MIL/uL — ABNORMAL LOW (ref 4.22–5.81)
RDW: 12.6 % (ref 11.5–15.5)
WBC: 7.8 10*3/uL (ref 4.0–10.5)

## 2014-02-11 MED ORDER — CLONIDINE HCL 0.1 MG PO TABS: 0.1000 mg | ORAL_TABLET | Freq: Every day | ORAL | Status: DC

## 2014-02-11 MED ORDER — OXYCODONE HCL 5 MG PO TABS: 5.0000 mg | ORAL_TABLET | Freq: Three times a day (TID) | ORAL | Status: DC | PRN

## 2014-02-11 MED ORDER — TIZANIDINE HCL 4 MG PO TABS: 4.0000 mg | ORAL_TABLET | Freq: Four times a day (QID) | ORAL | Status: DC | PRN

## 2014-02-11 MED ORDER — DSS 100 MG PO CAPS: 100.0000 mg | ORAL_CAPSULE | Freq: Two times a day (BID) | ORAL | Status: DC

## 2014-02-11 MED ORDER — OXYCODONE-ACETAMINOPHEN 10-325 MG PO TABS: 1.0000 | ORAL_TABLET | Freq: Three times a day (TID) | ORAL | Status: DC | PRN

## 2014-02-11 MED ORDER — ASPIRIN 325 MG PO TBEC: 325.0000 mg | DELAYED_RELEASE_TABLET | Freq: Two times a day (BID) | ORAL | Status: AC

## 2014-02-11 NOTE — Progress Notes (Signed)
PT Cancellation Note  Patient Details Name: Troy Hunt MRN: [id number] DOB: 13-Apr-1971   Cancelled Treatment:    Reason Eval/Treat Not Completed: Patient declined, no reason specified.  Pt states no further acute PT questions or concerns at this time.  Pt has been moving very well and anticipate D/C today.  If pt does not D/C will f/u another time.     Alison Murray Cameran Ahmed 02/11/2014, 8:25 AM

## 2014-02-11 NOTE — Progress Notes (Signed)
Subjective: 3 Days Post-Op Procedure(s) (LRB): RIGHT TOTAL HIP ARTHROPLASTY ANTERIOR APPROACH (Right) Patient reports pain as moderate.  Mobilizing very well.  Objective: Vital signs in last 24 hours: Temp:  [98 F (36.7 C)-98.6 F (37 C)] 98.4 F (36.9 C) (05/29 0452) Pulse Rate:  [90-93] 90 (05/29 0452) Resp:  [16-18] 17 (05/29 0452) BP: (127-129)/(73-81) 129/75 mmHg (05/29 0452) SpO2:  [96 %-99 %] 96 % (05/29 0452)  Intake/Output from previous day: 05/28 0701 - 05/29 0700 In: 2030 [P.O.:980; I.V.:1050] Out: 3100 [Urine:3100] Intake/Output this shift:     Recent Labs  02/09/14 0525 02/10/14 0553  HGB 14.1 12.3*    Recent Labs  02/09/14 0525 02/10/14 0553  WBC 12.8* 9.7  RBC 4.47 3.84*  HCT 40.0 35.2*  PLT 203 150    Recent Labs  02/09/14 0525  NA 137  K 4.9  CL 100  CO2 25  BUN 7  CREATININE 0.74  GLUCOSE 126*  CALCIUM 9.0   No results found for this basename: LABPT, INR,  in the last 72 hours  Sensation intact distally Intact pulses distally Dorsiflexion/Plantar flexion intact Incision: scant drainage Compartment soft  Assessment/Plan: 3 Days Post-Op Procedure(s) (LRB): RIGHT TOTAL HIP ARTHROPLASTY ANTERIOR APPROACH (Right) Discharge home with home health  Kathryne Hitch 02/11/2014, 6:51 AM

## 2014-02-11 NOTE — Discharge Summary (Signed)
Patient ID: Troy Hunt MRN: [id number] DOB/AGE: 1971/06/06 43 y.o.  Admit date: 02/08/2014 Discharge date: 02/11/2014  Admission Diagnoses:  Principal Problem:   Avascular necrosis of bone of right hip Active Problems:   Status post THR (total hip replacement)   Discharge Diagnoses:  Same  Past Medical History  Diagnosis Date  . Closed femur fracture 2012    right  . Hypertension   . Avascular necrosis     Surgeries: Procedure(s): RIGHT TOTAL HIP ARTHROPLASTY ANTERIOR APPROACH on 02/08/2014   Consultants:    Discharged Condition: Improved  Hospital Course: Troy Hunt is an 43 y.o. male who was admitted 02/08/2014 for operative treatment ofAvascular necrosis of bone of right hip. Patient has severe unremitting pain that affects sleep, daily activities, and work/hobbies. After pre-op clearance the patient was taken to the operating room on 02/08/2014 and underwent  Procedure(s): RIGHT TOTAL HIP ARTHROPLASTY ANTERIOR APPROACH.    Patient was given perioperative antibiotics: Anti-infectives   Start     Dose/Rate Route Frequency Ordered Stop   02/09/14 0900  ceFAZolin (ANCEF) IVPB 1 g/50 mL premix     1 g 100 mL/hr over 30 Minutes Intravenous  Once 02/09/14 0355 02/09/14 0958   02/08/14 1930  ceFAZolin (ANCEF) IVPB 1 g/50 mL premix     1 g 100 mL/hr over 30 Minutes Intravenous Every 6 hours 02/08/14 1821 02/09/14 0315   02/08/14 0600  ceFAZolin (ANCEF) IVPB 2 g/50 mL premix     2 g 100 mL/hr over 30 Minutes Intravenous On call to O.R. 02/07/14 1237 02/08/14 1328       Patient was given sequential compression devices, early ambulation, and chemoprophylaxis to prevent DVT.  Patient benefited maximally from hospital stay and there were no complications.    Recent vital signs: Patient Vitals for the past 24 hrs:  BP Temp Pulse Resp SpO2  02/11/14 0452 129/75 mmHg 98.4 F (36.9 C) 90 17 96 %  02/11/14 0400 - - - 16 -  02/11/14 0000 - - - 17 -  02/10/14 2046  129/81 mmHg 98 F (36.7 C) 93 18 96 %  02/10/14 2000 - - - 17 -  02/10/14 1424 127/73 mmHg 98.6 F (37 C) 92 16 99 %     Recent laboratory studies:  Recent Labs  02/09/14 0525 02/10/14 0553  WBC 12.8* 9.7  HGB 14.1 12.3*  HCT 40.0 35.2*  PLT 203 150  NA 137  --   K 4.9  --   CL 100  --   CO2 25  --   BUN 7  --   CREATININE 0.74  --   GLUCOSE 126*  --   CALCIUM 9.0  --      Discharge Medications:     Medication List    STOP taking these medications       BIOFREEZE EX      TAKE these medications       aspirin 325 MG EC tablet  Take 1 tablet (325 mg total) by mouth 2 (two) times daily after a meal.     cloNIDine 0.1 MG tablet  Commonly known as:  CATAPRES  Take 1 tablet (0.1 mg total) by mouth daily.     DSS 100 MG Caps  Take 100 mg by mouth 2 (two) times daily.     oxyCODONE 5 MG immediate release tablet  Commonly known as:  Oxy IR/ROXICODONE  Take 1-2 tablets (5-10 mg total) by mouth every 8 (eight) hours as needed (  Breakthrough pain).     oxyCODONE-acetaminophen 10-325 MG per tablet  Commonly known as:  PERCOCET  Take 1 tablet by mouth every 8 (eight) hours as needed for pain.     tiZANidine 4 MG tablet  Commonly known as:  ZANAFLEX  Take 1 tablet (4 mg total) by mouth every 6 (six) hours as needed for muscle spasms.        Diagnostic Studies: Dg Chest 2 View  02/01/2014   CLINICAL DATA:  Hypertension.  EXAM: CHEST  2 VIEW  COMPARISON:  None.  FINDINGS: The heart size and mediastinal contours are within normal limits. Both lungs are clear. No pneumothorax or pleural effusion is noted. The visualized skeletal structures are unremarkable.  IMPRESSION: No acute cardiopulmonary abnormality seen.   Electronically Signed   By: Roque LiasJames  Green M.D.   On: 02/01/2014 10:25   Dg Hip Operative Right  02/08/2014   CLINICAL DATA:  43 year old male undergoing right anterior total hip arthroplasty. Initial encounter.  EXAM: DG OPERATIVE RIGHT HIP  TECHNIQUE:  fluoroscopic images of the right hip are submitted.  COMPARISON:  06/20/2013 right hip series.  FLUOROSCOPY TIME:  0 min 42 seconds.  FINDINGS: Two intraoperative fluoroscopic views of the right hip. Sequelae of right total hip arthroplasty, plus malleable plate and screw fixation of the right hemipelvis. Hardware appears intact and normally aligned on these views.  IMPRESSION: Right total hip arthroplasty and right hemipelvis ORIF with no adverse features identified.   Electronically Signed   By: Augusto GambleLee  Hall M.D.   On: 02/08/2014 16:45   Dg Pelvis Portable  02/08/2014   CLINICAL DATA:  Right hip arthroplasty  EXAM: PORTABLE PELVIS 1-2 VIEWS  COMPARISON:  02/08/2014  FINDINGS: Postop changes of the right acetabulum for a remote acetabular fracture. Right hip arthroplasty changes noted. Components appear aligned in the frontal plane. Postop changes of the soft tissues. No hardware abnormality or definite acute osseous finding.  IMPRESSION: Expected appearance status post right hip arthroplasty.  Remote right acetabular ORIF   Electronically Signed   By: Ruel Favorsrevor  Shick M.D.   On: 02/08/2014 17:55   Dg Hip Portable 1 View Right  02/08/2014   CLINICAL DATA:  Postop right hip arthroplasty  EXAM: PORTABLE RIGHT HIP - 1 VIEW  COMPARISON:  02/08/2014  FINDINGS: Portable crosstable lateral view demonstrates postop changes of the soft tissues overlying the right hip arthroplasty. Components appear aligned in the lateral projection. No definite hardware abnormality or acute osseous finding.  IMPRESSION: Status post right hip arthroplasty.  No complicating feature.   Electronically Signed   By: Ruel Favorsrevor  Shick M.D.   On: 02/08/2014 17:50    Disposition: 06-Home-Health Care Svc      Discharge Instructions   Call MD / Call 911    Complete by:  As directed   If you experience chest pain or shortness of breath, CALL 911 and be transported to the hospital emergency room.  If you develope a fever above 101 F, pus (white  drainage) or increased drainage or redness at the wound, or calf pain, call your surgeon's office.     Constipation Prevention    Complete by:  As directed   Drink plenty of fluids.  Prune juice may be helpful.  You may use a stool softener, such as Colace (over the counter) 100 mg twice a day.  Use MiraLax (over the counter) for constipation as needed.     Diet - low sodium heart healthy    Complete by:  As directed      Discharge instructions    Complete by:  As directed   Increase activities as comfort allows. You can get your current dressing wet in the shower. You can remove your current dressing 02/15/14 and start getting your actual incision wet daily; then new dry dressing daily     Discharge patient    Complete by:  As directed      Increase activity slowly as tolerated    Complete by:  As directed            Follow-up Information   Follow up with [facility]. (Someone from [facility] will contact you concerning start date and time for physical therapy.)    Contact information:   [address] [place] [address] [phone]       Follow up with Kathryne Hitch, MD In 2 weeks.   Specialty:  Orthopedic Surgery   Contact information:   [address] [place] [address] [phone]        Signed: Kathryne Hitch 02/11/2014, 6:57 AM

## 2014-03-11 NOTE — OR Nursing (Signed)
Addendum to scope page 

## 2014-08-01 ENCOUNTER — Emergency Department (HOSPITAL_COMMUNITY): Payer: Medicaid Other

## 2014-08-01 ENCOUNTER — Encounter (HOSPITAL_COMMUNITY): Payer: Self-pay | Admitting: Emergency Medicine

## 2014-08-01 ENCOUNTER — Emergency Department (HOSPITAL_COMMUNITY)
Admission: EM | Admit: 2014-08-01 | Discharge: 2014-08-01 | Disposition: A | Discharge status: Home / Self Care | Payer: Medicaid Other | Attending: Emergency Medicine | Admitting: Emergency Medicine

## 2014-08-01 DIAGNOSIS — Z79899 Other long term (current) drug therapy: Secondary | ICD-10-CM | POA: Insufficient documentation

## 2014-08-01 DIAGNOSIS — Z8781 Personal history of (healed) traumatic fracture: Secondary | ICD-10-CM | POA: Insufficient documentation

## 2014-08-01 DIAGNOSIS — I10 Essential (primary) hypertension: Secondary | ICD-10-CM | POA: Insufficient documentation

## 2014-08-01 DIAGNOSIS — M545 Low back pain, unspecified: Secondary | ICD-10-CM

## 2014-08-01 DIAGNOSIS — Z7982 Long term (current) use of aspirin: Secondary | ICD-10-CM | POA: Diagnosis not present

## 2014-08-01 DIAGNOSIS — Z8739 Personal history of other diseases of the musculoskeletal system and connective tissue: Secondary | ICD-10-CM | POA: Insufficient documentation

## 2014-08-01 DIAGNOSIS — M25551 Pain in right hip: Secondary | ICD-10-CM

## 2014-08-01 DIAGNOSIS — R52 Pain, unspecified: Secondary | ICD-10-CM

## 2014-08-01 LAB — BASIC METABOLIC PANEL
ANION GAP: 10 (ref 5–15)
BUN: 11 mg/dL (ref 6–23)
CALCIUM: 9.5 mg/dL (ref 8.4–10.5)
CO2: 28 mEq/L (ref 19–32)
Chloride: 101 mEq/L (ref 96–112)
Creatinine, Ser: 0.89 mg/dL (ref 0.50–1.35)
GFR calc Af Amer: >90 mL/min (ref >90)
GFR calc non Af Amer: >90 mL/min (ref >90)
Glucose, Bld: 95 mg/dL (ref 70–99)
Potassium: 4.7 mEq/L (ref 3.7–5.3)
SODIUM: 139 meq/L (ref 137–147)

## 2014-08-01 MED ORDER — OXYCODONE-ACETAMINOPHEN 10-325 MG PO TABS: 1.0000 | ORAL_TABLET | Freq: Three times a day (TID) | ORAL | Status: DC | PRN

## 2014-08-01 MED ORDER — OXYCODONE-ACETAMINOPHEN 5-325 MG PO TABS
1.0000 | ORAL_TABLET | Freq: Once | ORAL | Status: AC
  Administered 2014-08-01: 1 via ORAL
  Filled 2014-08-01: qty 1

## 2014-08-01 MED ORDER — CLONIDINE HCL 0.1 MG PO TABS
0.1000 mg | ORAL_TABLET | Freq: Once | ORAL | Status: AC
  Administered 2014-08-01: 0.1 mg via ORAL
  Filled 2014-08-01: qty 1

## 2014-08-01 NOTE — Discharge Instructions (Signed)
Hypertension °Hypertension, commonly called high blood pressure, is when the force of blood pumping through your arteries is too strong. Your arteries are the blood vessels that carry blood from your heart throughout your body. A blood pressure reading consists of a higher number over a lower number, such as 110/72. The higher number (systolic) is the pressure inside your arteries when your heart pumps. The lower number (diastolic) is the pressure inside your arteries when your heart relaxes. Ideally you want your blood pressure below 120/80. °Hypertension forces your heart to work harder to pump blood. Your arteries may become narrow or stiff. Having hypertension puts you at risk for heart disease, stroke, and other problems.  °RISK FACTORS °Some risk factors for high blood pressure are controllable. Others are not.  °Risk factors you cannot control include:  °· Race. You may be at higher risk if you are African American. °· Age. Risk increases with age. °· Gender. Men are at higher risk than women before age 45 years. After age 65, women are at higher risk than men. °Risk factors you can control include: °· Not getting enough exercise or physical activity. °· Being overweight. °· Getting too much fat, sugar, calories, or salt in your diet. °· Drinking too much alcohol. °SIGNS AND SYMPTOMS °Hypertension does not usually cause signs or symptoms. Extremely high blood pressure (hypertensive crisis) may cause headache, anxiety, shortness of breath, and nosebleed. °DIAGNOSIS  °To check if you have hypertension, your health care provider will measure your blood pressure while you are seated, with your arm held at the level of your heart. It should be measured at least twice using the same arm. Certain conditions can cause a difference in blood pressure between your right and left arms. A blood pressure reading that is higher than normal on one occasion does not mean that you need treatment. If one blood pressure reading  is high, ask your health care provider about having it checked again. °TREATMENT  °Treating high blood pressure includes making lifestyle changes and possibly taking medicine. Living a healthy lifestyle can help lower high blood pressure. You may need to change some of your habits. °Lifestyle changes may include: °· Following the DASH diet. This diet is high in fruits, vegetables, and whole grains. It is low in salt, red meat, and added sugars. °· Getting at least 2½ hours of brisk physical activity every week. °· Losing weight if necessary. °· Not smoking. °· Limiting alcoholic beverages. °· Learning ways to reduce stress. ° If lifestyle changes are not enough to get your blood pressure under control, your health care provider may prescribe medicine. You may need to take more than one. Work closely with your health care provider to understand the risks and benefits. °HOME CARE INSTRUCTIONS °· Have your blood pressure rechecked as directed by your health care provider.   °· Take medicines only as directed by your health care provider. Follow the directions carefully. Blood pressure medicines must be taken as prescribed. The medicine does not work as well when you skip doses. Skipping doses also puts you at risk for problems.   °· Do not smoke.   °· Monitor your blood pressure at home as directed by your health care provider.  °SEEK MEDICAL CARE IF:  °· You think you are having a reaction to medicines taken. °· You have recurrent headaches or feel dizzy. °· You have swelling in your ankles. °· You have trouble with your vision. °SEEK IMMEDIATE MEDICAL CARE IF: °· You develop a severe headache or confusion. °·   You have unusual weakness, numbness, or feel faint.  You have severe chest or abdominal pain.  You vomit repeatedly.  You have trouble breathing. MAKE SURE YOU:   Understand these instructions.  Will watch your condition.  Will get help right away if you are not doing well or get worse. Document  Released: 09/02/2005 Document Revised: 01/17/2014 Document Reviewed: 06/25/2013 [facility] Patient Information 2015 [place], [place]. This information is not intended to replace advice given to you by your health care provider. Make sure you discuss any questions you have with your health care provider.  Hip Pain Your hip is the joint between your upper legs and your lower pelvis. The bones, cartilage, tendons, and muscles of your hip joint perform a lot of work each day supporting your body weight and allowing you to move around. Hip pain can range from a minor ache to severe pain in one or both of your hips. Pain may be felt on the inside of the hip joint near the groin, or the outside near the buttocks and upper thigh. You may have swelling or stiffness as well.  HOME CARE INSTRUCTIONS   Take medicines only as directed by your health care provider.  Apply ice to the injured area:  Put ice in a plastic bag.  Place a towel between your skin and the bag.  Leave the ice on for 15-20 minutes at a time, 3-4 times a day.  Keep your leg raised (elevated) when possible to lessen swelling.  Avoid activities that cause pain.  Follow specific exercises as directed by your health care provider.  Sleep with a pillow between your legs on your most comfortable side.  Record how often you have hip pain, the location of the pain, and what it feels like. SEEK MEDICAL CARE IF:   You are unable to put weight on your leg.  Your hip is red or swollen or very tender to touch.  Your pain or swelling continues or worsens after 1 week.  You have increasing difficulty walking.  You have a fever. SEEK IMMEDIATE MEDICAL CARE IF:   You have fallen.  You have a sudden increase in pain and swelling in your hip. MAKE SURE YOU:   Understand these instructions.  Will watch your condition.  Will get help right away if you are not doing well or get worse. Document Released: 02/20/2010 Document Revised:  01/17/2014 Document Reviewed: 04/29/2013 [facility] Patient Information 2015 [place], [place]. This information is not intended to replace advice given to you by your health care provider. Make sure you discuss any questions you have with your health care provider.

## 2014-08-01 NOTE — ED Notes (Signed)
PT BP is elevated, states he has taken his medication and not missed a dose
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
## 2014-09-29 ENCOUNTER — Encounter (HOSPITAL_COMMUNITY): Payer: Self-pay | Admitting: Orthopedic Surgery

## 2015-04-11 IMAGING — DX DG PELVIS 3+V JUDET
3 series · 3 of 3 positions shown · non-contrast
Comparison: Portable exam 8011 hr compared to earlier AP exam of
06/21/2013 at 9898 hr

Correlation: CT abdomen and pelvis 06/20/2013

CLINICAL DATA: Acetabular fracture

EXAM:
JUDET PELVIS - 3+ VIEW

[ap]
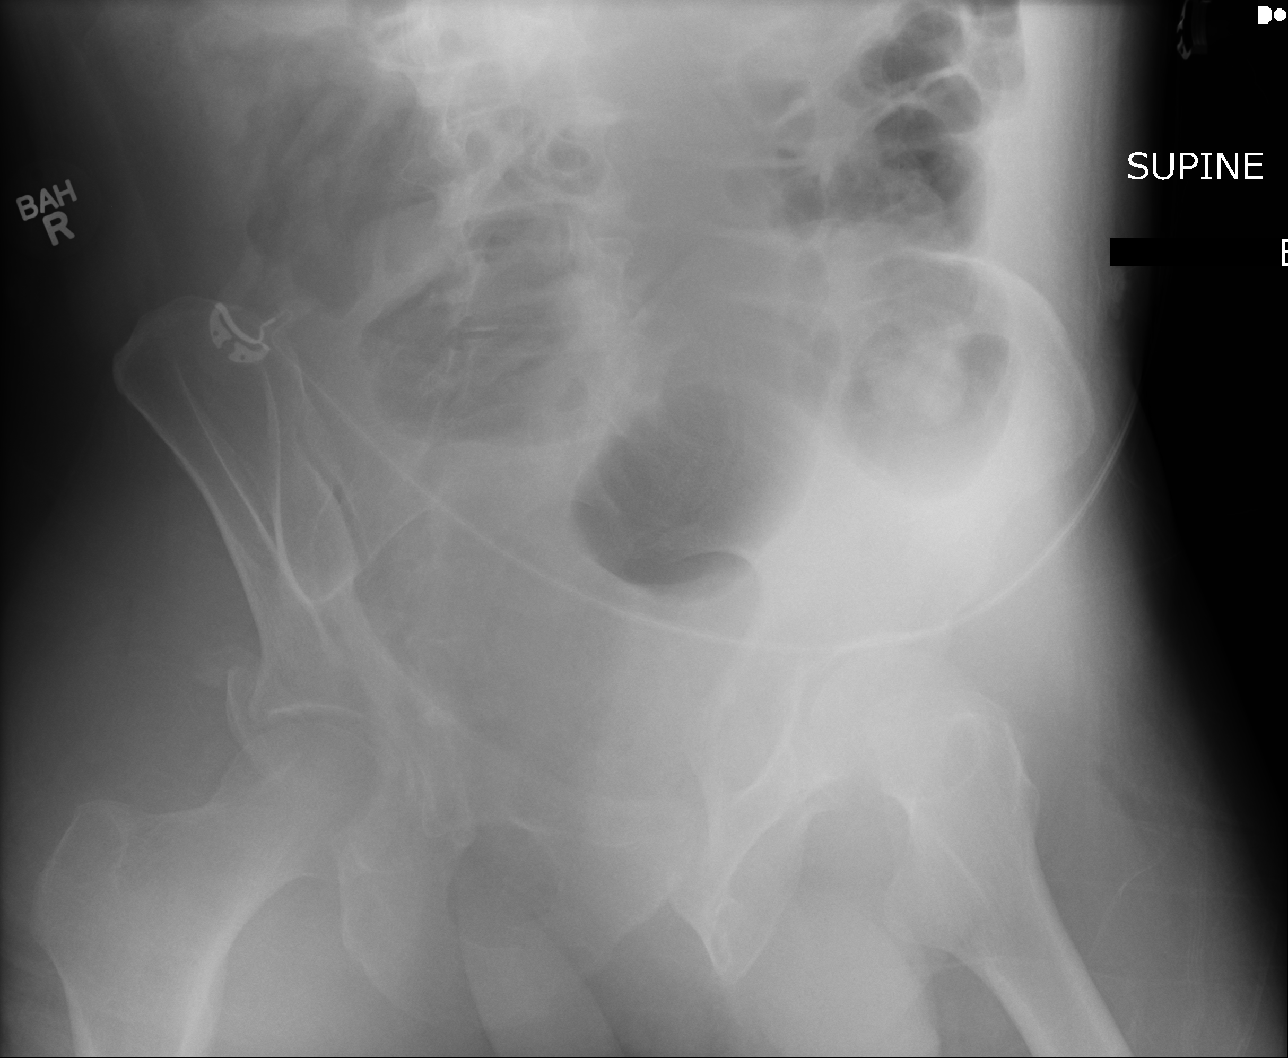

[judet (1 of 2)]
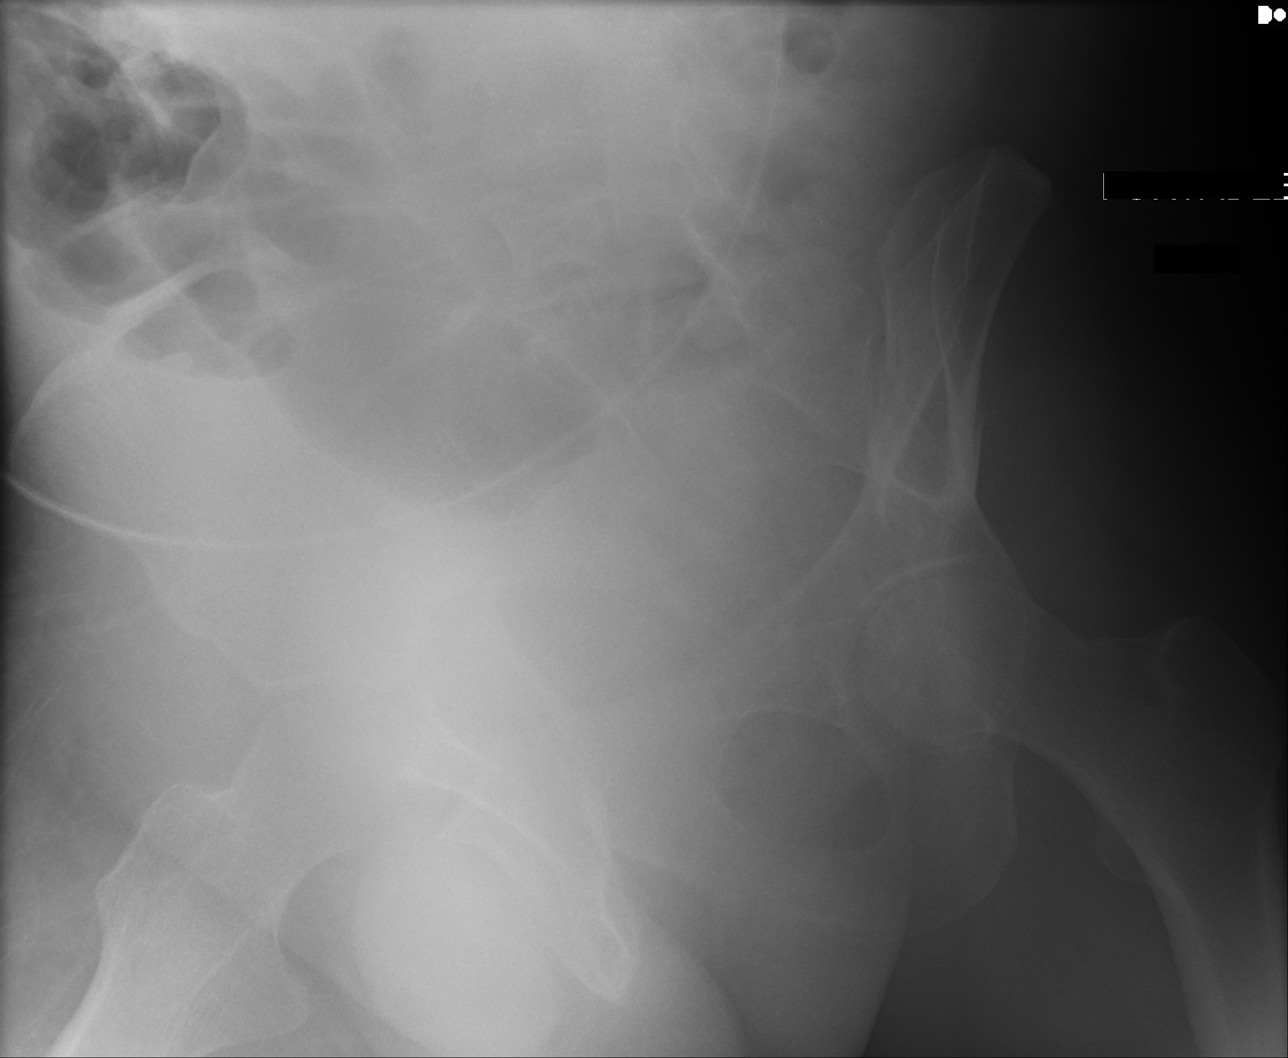

[judet (2 of 2)]
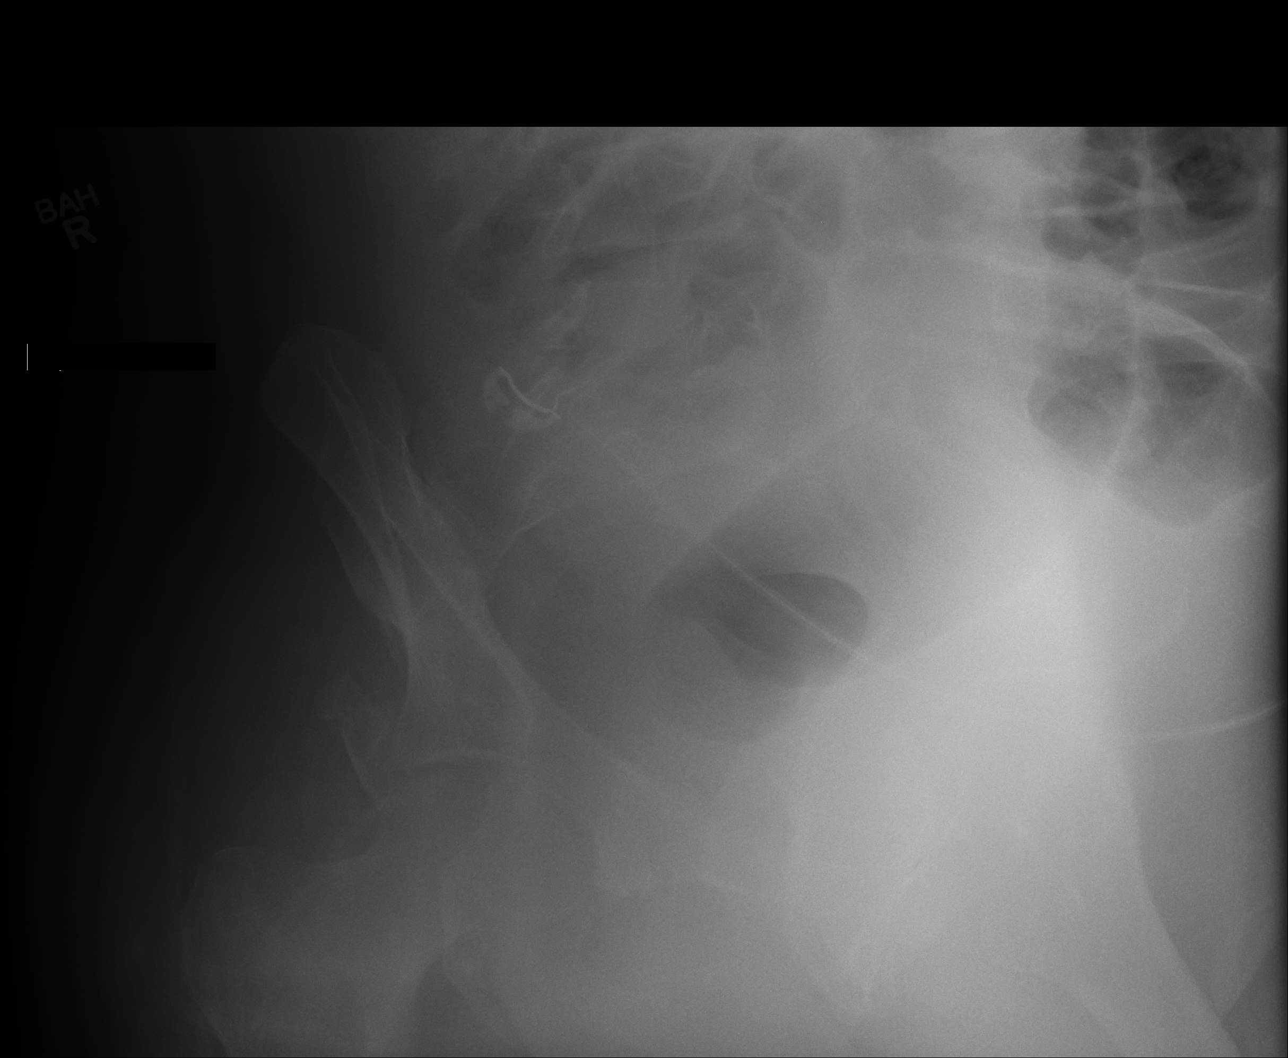

[3 of 3 positions shown; findings below may reference images not displayed]

FINDINGS: Examination limited by portable technique.

AP view was not repeated.

Left hip joint space normal appearance.

Displaced complex right acetabular fractures identified, less
well-visualized and poorly delineated versus the preceding CT.

Posterior column fragment is displaced posterior laterally.

The center portion of the acetabulum and anterior column are
inadequately visualized on this portable exam.

Mildly displaced right inferior pubic ramus fracture.
IMPRESSION: Suboptimal visualization of the complex the right acetabular
fracture identified by prior CT due to portable technique and body
habitus.

Multiple fracture planes are seen at the acetabulum with poor
delineation of fragments.

Minimally displaced inferior right pubic ramus fracture.

## 2015-04-12 IMAGING — CR DG PELVIS 3+V JUDET
4 series · 4 of 4 positions shown · non-contrast
Comparison: 06/20/2013

CLINICAL DATA: Postop acetabulum fixation.

EXAM:
JUDET PELVIS - 3+ VIEW

[[person_name] view]
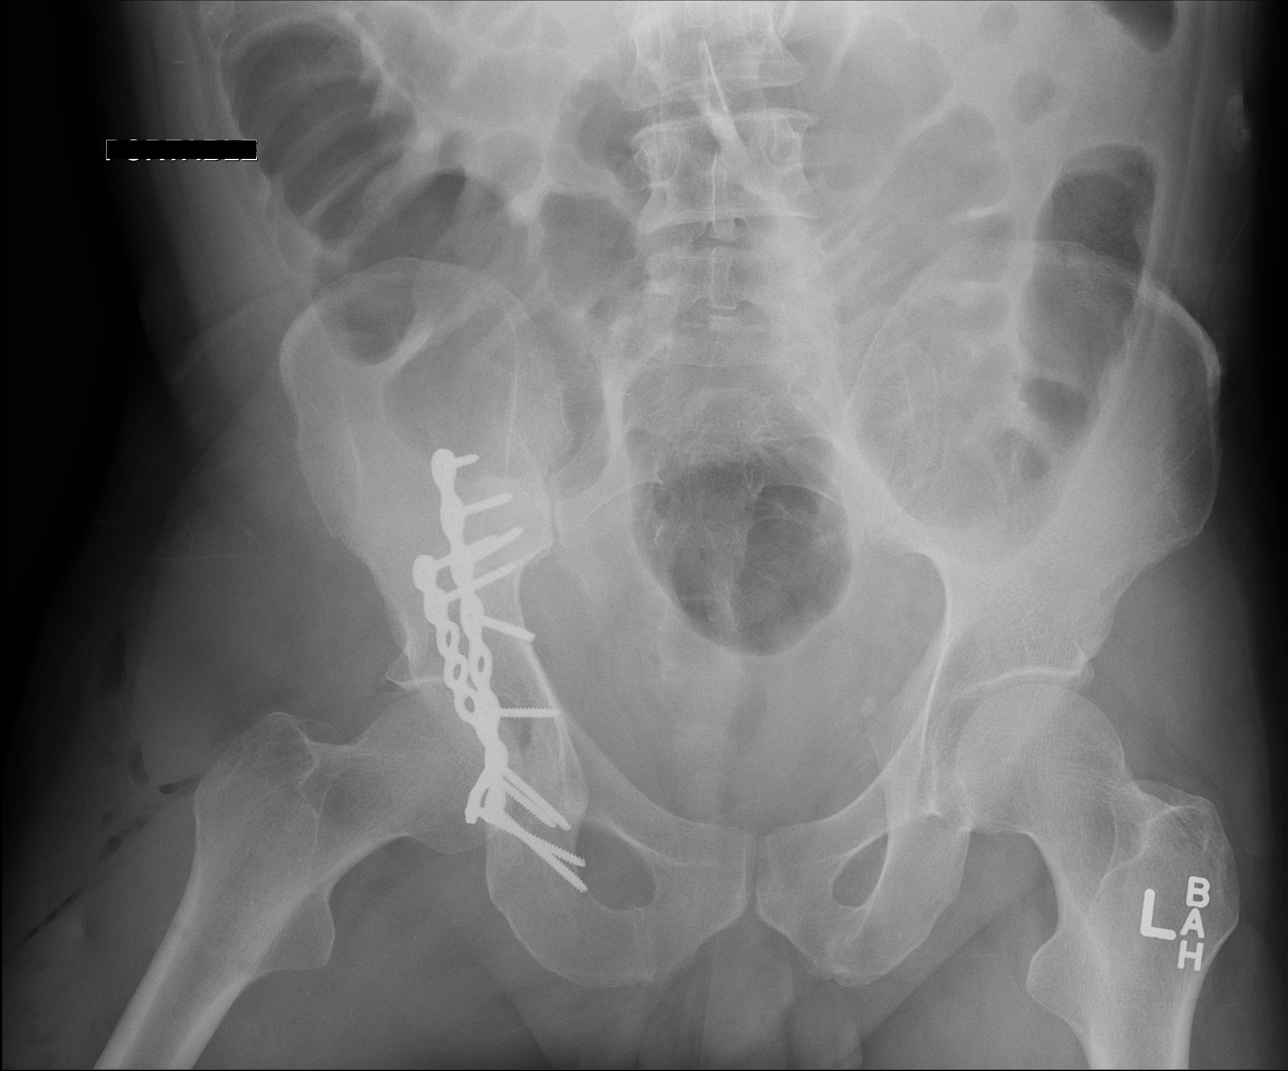

[AP (1 of 3)]
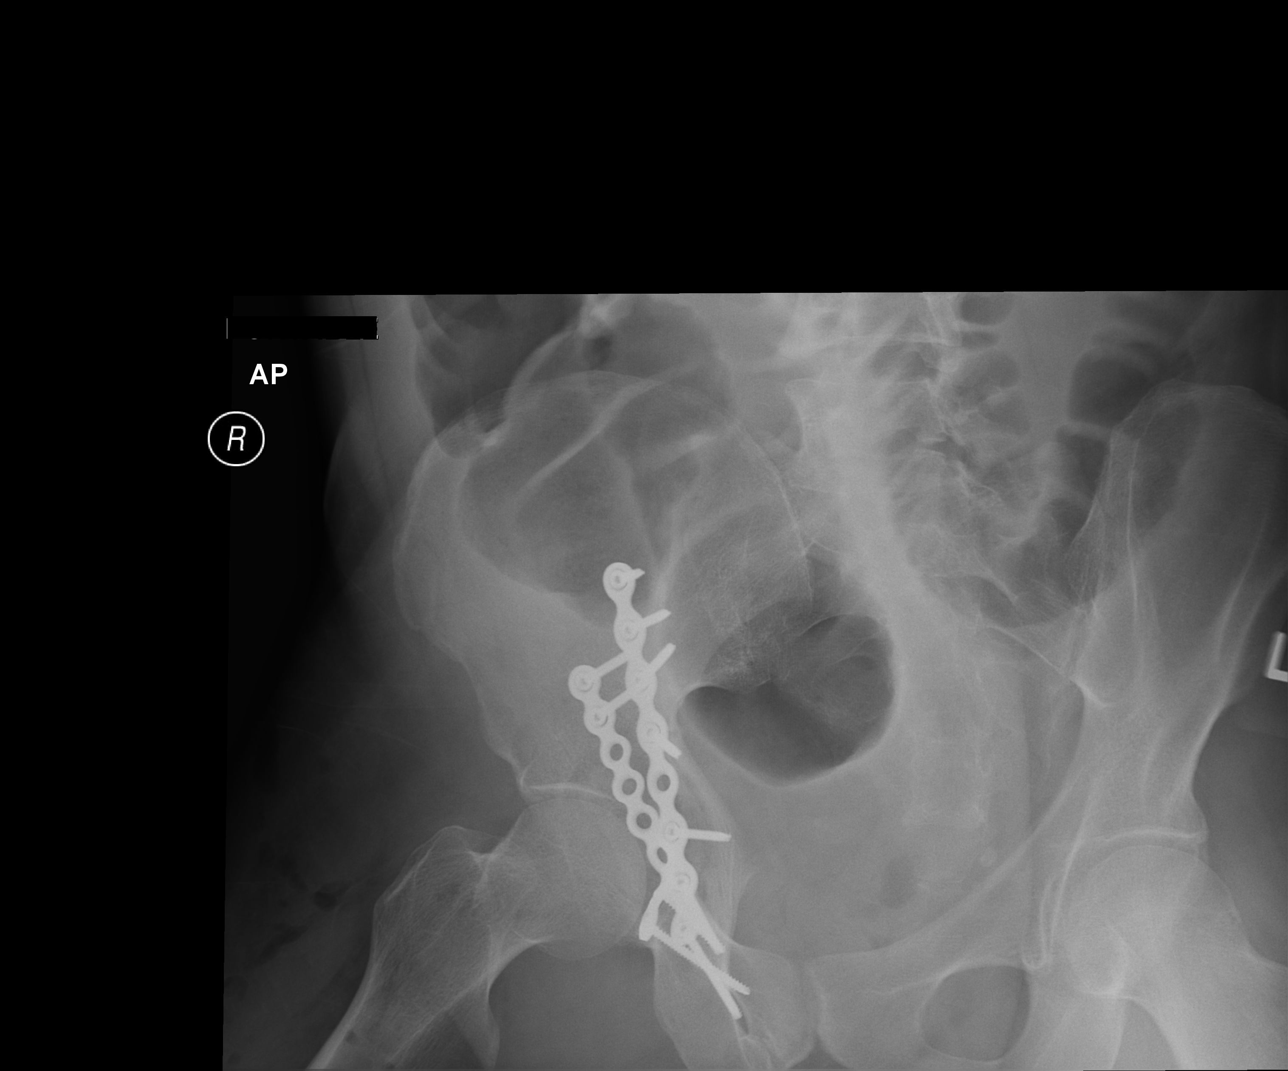

[AP (2 of 3)]
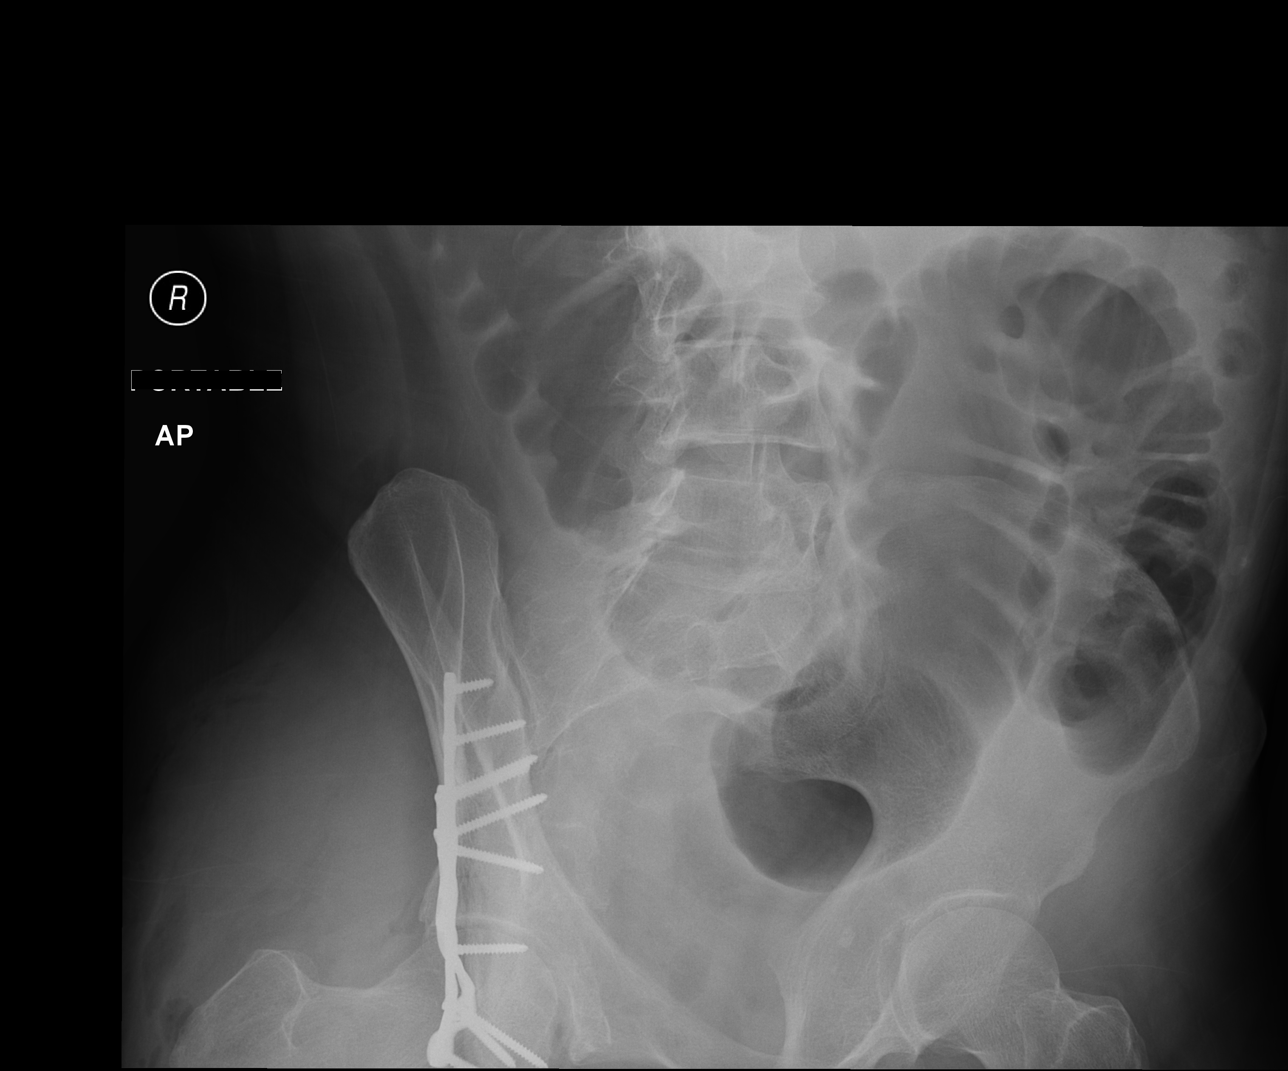

[AP (3 of 3)]
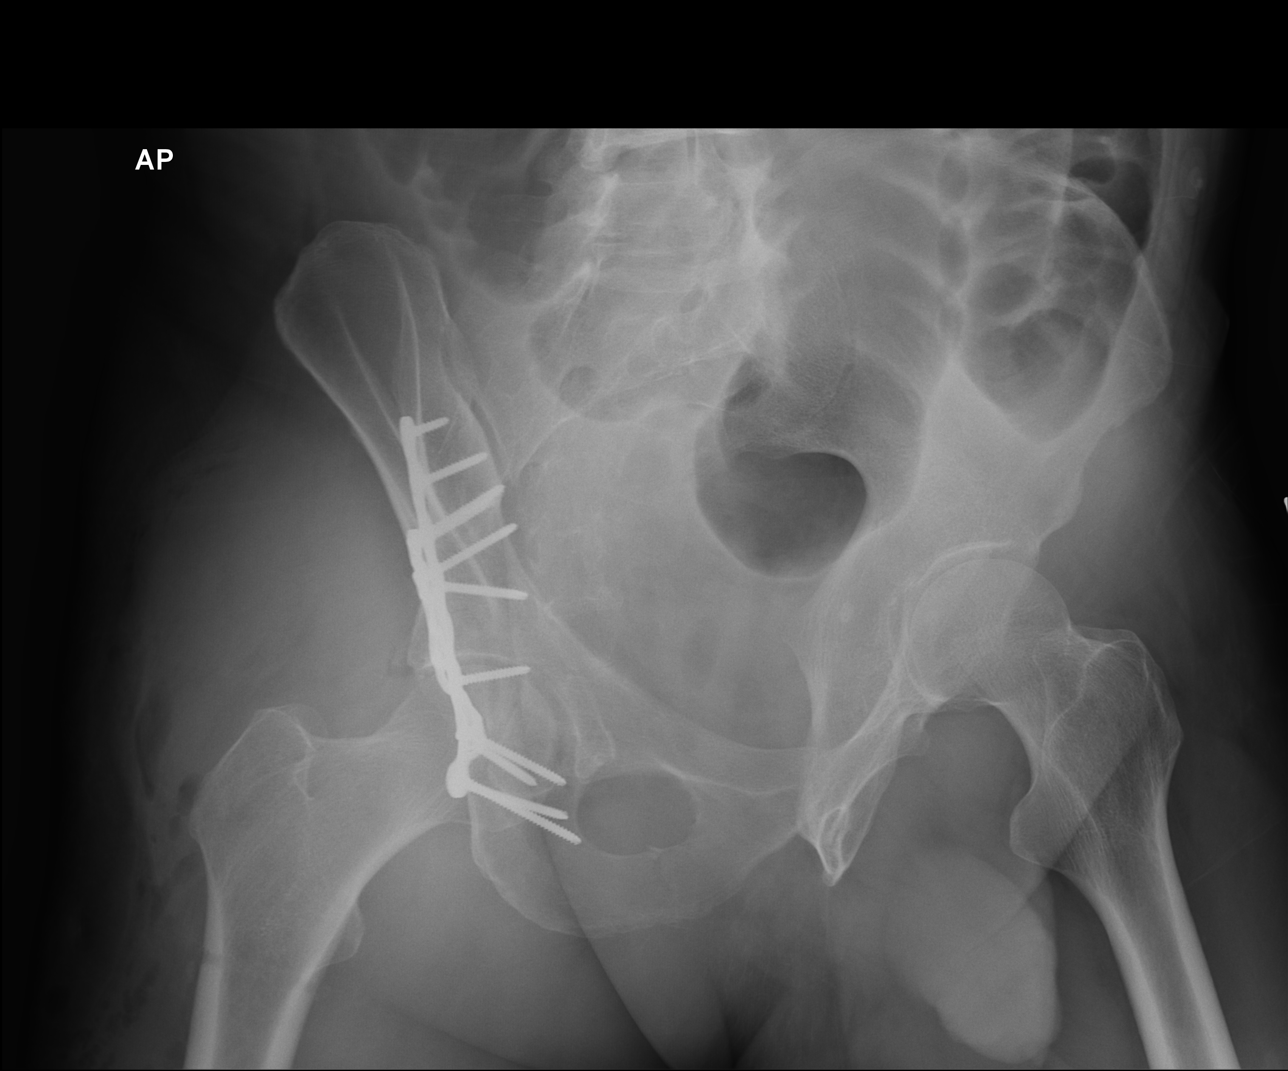

[4 of 4 positions shown; findings below may reference images not displayed]

FINDINGS: Examination demonstrates fixation plates with screws impression
patient's known right acetabular fracture as there is anatomic
alignment about the fracture site with hardware intact. There is
normal anatomic alignment of the patient's known right inferior
pubic ramus fracture. Remainder of the exam is unchanged.
IMPRESSION: Anatomic alignment about patient's right acetabular fracture post
fixation with hardware intact. Anatomic alignment over patient's
right superior pubic ramus fracture.

## 2015-07-12 ENCOUNTER — Ambulatory Visit (INDEPENDENT_AMBULATORY_CARE_PROVIDER_SITE_OTHER): Payer: Medicaid Other | Admitting: Family Medicine

## 2015-07-12 ENCOUNTER — Encounter: Payer: Self-pay | Admitting: Family Medicine

## 2015-07-12 VITALS — Temp 97.3°F | Ht 68.0 in | Wt 207.4 lb

## 2015-07-12 DIAGNOSIS — K2100 Gastro-esophageal reflux disease with esophagitis, without bleeding: Secondary | ICD-10-CM

## 2015-07-12 DIAGNOSIS — Z96649 Presence of unspecified artificial hip joint: Secondary | ICD-10-CM

## 2015-07-12 DIAGNOSIS — G8929 Other chronic pain: Secondary | ICD-10-CM | POA: Diagnosis not present

## 2015-07-12 DIAGNOSIS — M87051 Idiopathic aseptic necrosis of right femur: Secondary | ICD-10-CM | POA: Diagnosis not present

## 2015-07-12 DIAGNOSIS — M25551 Pain in right hip: Secondary | ICD-10-CM | POA: Diagnosis not present

## 2015-07-12 DIAGNOSIS — I10 Essential (primary) hypertension: Secondary | ICD-10-CM | POA: Diagnosis not present

## 2015-07-12 DIAGNOSIS — Z9660 Presence of unspecified orthopedic joint implant: Secondary | ICD-10-CM | POA: Diagnosis not present

## 2015-07-12 LAB — POCT URINALYSIS DIPSTICK
Bilirubin, UA: NEGATIVE
Blood, UA: NEGATIVE
Glucose, UA: NEGATIVE
KETONES UA: NEGATIVE
LEUKOCYTES UA: NEGATIVE
Nitrite, UA: NEGATIVE
Spec Grav, UA: >=1.03
Urobilinogen, UA: NEGATIVE
pH, UA: 5

## 2015-07-12 MED ORDER — METOPROLOL SUCCINATE ER 50 MG PO TB24: 50.0000 mg | ORAL_TABLET | Freq: Every day | ORAL | Status: DC

## 2015-07-12 MED ORDER — PANTOPRAZOLE SODIUM 40 MG PO TBEC: 40.0000 mg | DELAYED_RELEASE_TABLET | Freq: Every day | ORAL | Status: DC

## 2015-07-12 MED ORDER — PREGABALIN 75 MG PO CAPS: ORAL_CAPSULE | ORAL | Status: DC
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
## 2015-07-13 ENCOUNTER — Telehealth: Payer: Self-pay | Admitting: Family Medicine

## 2015-07-13 LAB — CBC WITH DIFFERENTIAL/PLATELET
BASOS: 0 %
Basophils Absolute: 0 10*3/uL (ref 0.0–0.2)
EOS (ABSOLUTE): 0.3 10*3/uL (ref 0.0–0.4)
EOS: 4 %
HEMATOCRIT: 46.6 % (ref 37.5–51.0)
Hemoglobin: 16.8 g/dL (ref 12.6–17.7)
IMMATURE GRANS (ABS): 0 10*3/uL (ref 0.0–0.1)
Immature Granulocytes: 0 %
Lymphocytes Absolute: 1.8 10*3/uL (ref 0.7–3.1)
Lymphs: 22 %
MCH: 31.9 pg (ref 26.6–33.0)
MCHC: 36.1 g/dL — ABNORMAL HIGH (ref 31.5–35.7)
MCV: 88 fL (ref 79–97)
MONOCYTES: 10 %
Monocytes Absolute: 0.8 10*3/uL (ref 0.1–0.9)
NEUTROS PCT: 64 %
Neutrophils Absolute: 5.1 10*3/uL (ref 1.4–7.0)
Platelets: 181 10*3/uL (ref 150–379)
RBC: 5.27 x10E6/uL (ref 4.14–5.80)
RDW: 12.7 % (ref 12.3–15.4)
WBC: 8.1 10*3/uL (ref 3.4–10.8)

## 2015-07-13 LAB — CMP14+EGFR
ALBUMIN: 4.6 g/dL (ref 3.5–5.5)
ALK PHOS: 93 IU/L (ref 39–117)
ALT: 73 IU/L — ABNORMAL HIGH (ref 0–44)
AST: 49 IU/L — ABNORMAL HIGH (ref 0–40)
Albumin/Globulin Ratio: 1.4 (ref 1.1–2.5)
BUN / CREAT RATIO: 12 (ref 9–20)
BUN: 11 mg/dL (ref 6–24)
Bilirubin Total: 0.7 mg/dL (ref 0.0–1.2)
CALCIUM: 9.4 mg/dL (ref 8.7–10.2)
CO2: 24 mmol/L (ref 18–29)
CREATININE: 0.92 mg/dL (ref 0.76–1.27)
Chloride: 97 mmol/L (ref 97–106)
GFR calc Af Amer: 116 mL/min/{1.73_m2} (ref >59)
GFR, EST NON AFRICAN AMERICAN: 100 mL/min/{1.73_m2} (ref >59)
GLOBULIN, TOTAL: 3.3 g/dL (ref 1.5–4.5)
Glucose: 94 mg/dL (ref 65–99)
Potassium: 4.6 mmol/L (ref 3.5–5.2)
Sodium: 141 mmol/L (ref 136–144)
TOTAL PROTEIN: 7.9 g/dL (ref 6.0–8.5)

## 2015-07-13 LAB — LIPID PANEL
Chol/HDL Ratio: 3.5 ratio units (ref 0.0–5.0)
Cholesterol, Total: 215 mg/dL — ABNORMAL HIGH (ref 100–199)
HDL: 62 mg/dL (ref >39)
LDL CALC: 132 mg/dL — AB (ref 0–99)
Triglycerides: 104 mg/dL (ref 0–149)
VLDL CHOLESTEROL CAL: 21 mg/dL (ref 5–40)

## 2015-07-13 NOTE — Telephone Encounter (Signed)
-----   Message from Mechele ClaudeWarren Stacks, MD sent at 07/13/2015  2:35 PM EDT ----- Roney MansHello Xiong, Your cholesterol is borderline. Please use a low-fat diet. We should recheck it at least once a year. Best Regards, Mechele ClaudeWarren Stacks, M.D.

## 2015-07-13 NOTE — Telephone Encounter (Signed)
Pt notified of results Verbalizes understanding 

## 2015-07-19 LAB — TOXASSURE SELECT 13 (MW), URINE: PDF: 0

## 2015-07-21 ENCOUNTER — Telehealth: Payer: Self-pay | Admitting: Family Medicine

## 2015-07-24 NOTE — Telephone Encounter (Signed)
error 

## 2015-07-26 ENCOUNTER — Ambulatory Visit: Payer: Medicaid Other | Admitting: Family Medicine

## 2015-07-27 ENCOUNTER — Encounter: Payer: Self-pay | Admitting: Family Medicine

## 2016-01-08 ENCOUNTER — Other Ambulatory Visit: Payer: Self-pay | Admitting: Family Medicine

## 2016-01-09 NOTE — Telephone Encounter (Signed)
Authorize 30 days only. Then contact the patient letting them know that they will need an appointment before any further prescriptions can be sent in. 

## 2016-01-09 NOTE — Telephone Encounter (Signed)
Last seen  07/12/2015

## 2017-02-11 ENCOUNTER — Emergency Department (HOSPITAL_COMMUNITY)
Admission: EM | Admit: 2017-02-11 | Discharge: 2017-02-11 | Disposition: A | Discharge status: Home / Self Care | Payer: Self-pay | Attending: Emergency Medicine | Admitting: Emergency Medicine

## 2017-02-11 ENCOUNTER — Emergency Department (HOSPITAL_COMMUNITY): Payer: Self-pay

## 2017-02-11 ENCOUNTER — Encounter (HOSPITAL_COMMUNITY): Payer: Self-pay

## 2017-02-11 DIAGNOSIS — Z7982 Long term (current) use of aspirin: Secondary | ICD-10-CM | POA: Insufficient documentation

## 2017-02-11 DIAGNOSIS — I10 Essential (primary) hypertension: Secondary | ICD-10-CM | POA: Insufficient documentation

## 2017-02-11 DIAGNOSIS — Z760 Encounter for issue of repeat prescription: Secondary | ICD-10-CM | POA: Insufficient documentation

## 2017-02-11 DIAGNOSIS — Z79899 Other long term (current) drug therapy: Secondary | ICD-10-CM | POA: Insufficient documentation

## 2017-02-11 DIAGNOSIS — M25471 Effusion, right ankle: Secondary | ICD-10-CM | POA: Insufficient documentation

## 2017-02-11 MED ORDER — HYDROCODONE-ACETAMINOPHEN 5-325 MG PO TABS: 1.0000 | ORAL_TABLET | ORAL | 0 refills | Status: DC | PRN

## 2017-02-11 MED ORDER — HYDROCODONE-ACETAMINOPHEN 5-325 MG PO TABS
1.0000 | ORAL_TABLET | Freq: Once | ORAL | Status: AC
Start: 2017-02-11 — End: 2017-02-11
  Administered 2017-02-11: 1 via ORAL
  Filled 2017-02-11: qty 1

## 2017-02-11 MED ORDER — LISINOPRIL 10 MG PO TABS: 20.0000 mg | ORAL_TABLET | Freq: Once | ORAL | Status: DC

## 2017-02-11 MED ORDER — NAPROXEN 500 MG PO TABS: 500.0000 mg | ORAL_TABLET | Freq: Two times a day (BID) | ORAL | 0 refills | Status: DC

## 2017-02-11 MED ORDER — LISINOPRIL 20 MG PO TABS: 20.0000 mg | ORAL_TABLET | Freq: Every day | ORAL | 0 refills | Status: DC

## 2017-02-11 NOTE — ED Triage Notes (Signed)
Pt woke up this morning with pain in r ankle and calf.  Denies injury.  Reports swelling in r ankle.  Capillary refill wnl, pedal pulse present.

## 2017-02-11 NOTE — Discharge Instructions (Signed)
Apply heat to your ankle,  elevation and minimizing weight bearing may also be helpful.  Take the medicines prescribed for pain and inflammation.  Do not drive within 4 hours of taking hydrocodone as this medication will make you drowsy.  He had been referred to Dr. Romeo AppleHarrison for further evaluation of your ankle if pain persists.  If not improving next couple of days with today's treatment, call his office or return here for further evaluation.
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
## 2017-02-11 NOTE — ED Notes (Signed)
Patient transported to X-ray 

## 2019-12-17 ENCOUNTER — Other Ambulatory Visit: Payer: Self-pay

## 2019-12-17 ENCOUNTER — Emergency Department (HOSPITAL_COMMUNITY)
Admission: EM | Admit: 2019-12-17 | Discharge: 2019-12-17 | Disposition: A | Discharge status: Home / Self Care | Payer: Self-pay | Attending: Emergency Medicine | Admitting: Emergency Medicine

## 2019-12-17 ENCOUNTER — Encounter (HOSPITAL_COMMUNITY): Payer: Self-pay

## 2019-12-17 DIAGNOSIS — Z79899 Other long term (current) drug therapy: Secondary | ICD-10-CM | POA: Insufficient documentation

## 2019-12-17 DIAGNOSIS — R040 Epistaxis: Secondary | ICD-10-CM | POA: Insufficient documentation

## 2019-12-17 DIAGNOSIS — Z7982 Long term (current) use of aspirin: Secondary | ICD-10-CM | POA: Insufficient documentation

## 2019-12-17 DIAGNOSIS — I10 Essential (primary) hypertension: Secondary | ICD-10-CM | POA: Insufficient documentation

## 2019-12-17 MED ORDER — LISINOPRIL 20 MG PO TABS: 20.0000 mg | ORAL_TABLET | Freq: Every day | ORAL | 1 refills | Status: DC

## 2019-12-17 MED ORDER — OXYMETAZOLINE HCL 0.05 % NA SOLN
1.0000 | Freq: Once | NASAL | Status: AC
Start: 2019-12-17 — End: 2019-12-17
  Administered 2019-12-17: 1 via NASAL
  Filled 2019-12-17: qty 30

## 2019-12-17 MED ORDER — HYDRALAZINE HCL 20 MG/ML IJ SOLN
5.0000 mg | Freq: Once | INTRAMUSCULAR | Status: AC
  Administered 2019-12-17: 20:00:00 5 mg via INTRAVENOUS
  Filled 2019-12-17: qty 1

## 2019-12-17 MED ORDER — HYDRALAZINE HCL 20 MG/ML IJ SOLN: 5.0000 mg | Freq: Once | INTRAMUSCULAR | Status: DC

## 2019-12-17 NOTE — ED Notes (Signed)
Pt in bed, pt denies bleeding or pain, family at bedside

## 2019-12-17 NOTE — Discharge Instructions (Addendum)
Start taking your blood pressure medicine and follow-up with your primary care doctor.  If your nose starts bleeding again then you should use that Afrin spray and hold your nose shot for 15 minutes if it does not stop then return to the emergency department

## 2019-12-17 NOTE — ED Triage Notes (Signed)
Pt to er via ems, states that pt has had a nose bleed for the past two hours, states that he also has some htn.  Pt denies pain.
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
## 2020-03-06 ENCOUNTER — Other Ambulatory Visit (HOSPITAL_COMMUNITY)
Admission: RE | Admit: 2020-03-06 | Discharge: 2020-03-06 | Disposition: A | Discharge status: Home / Self Care | Payer: Self-pay | Source: Ambulatory Visit | Attending: Physician Assistant | Admitting: Physician Assistant

## 2020-03-06 ENCOUNTER — Other Ambulatory Visit: Payer: Self-pay

## 2020-03-06 ENCOUNTER — Encounter: Payer: Self-pay | Admitting: Physician Assistant

## 2020-03-06 ENCOUNTER — Ambulatory Visit: Payer: Self-pay | Admitting: Physician Assistant

## 2020-03-06 VITALS — BP 182/114 | HR 93 | Temp 99.1°F | Ht 68.75 in | Wt 202.5 lb

## 2020-03-06 DIAGNOSIS — I10 Essential (primary) hypertension: Secondary | ICD-10-CM

## 2020-03-06 DIAGNOSIS — Z125 Encounter for screening for malignant neoplasm of prostate: Secondary | ICD-10-CM

## 2020-03-06 DIAGNOSIS — E785 Hyperlipidemia, unspecified: Secondary | ICD-10-CM

## 2020-03-06 DIAGNOSIS — Z1211 Encounter for screening for malignant neoplasm of colon: Secondary | ICD-10-CM

## 2020-03-06 DIAGNOSIS — Z7689 Persons encountering health services in other specified circumstances: Secondary | ICD-10-CM

## 2020-03-06 LAB — LIPID PANEL
Cholesterol: 198 mg/dL (ref 0–200)
HDL: 61 mg/dL (ref >40)
LDL Cholesterol: 121 mg/dL — ABNORMAL HIGH (ref 0–99)
Total CHOL/HDL Ratio: 3.2 RATIO
Triglycerides: 79 mg/dL (ref <150)
VLDL: 16 mg/dL (ref 0–40)

## 2020-03-06 LAB — COMPREHENSIVE METABOLIC PANEL
ALT: 59 U/L — ABNORMAL HIGH (ref 0–44)
AST: 62 U/L — ABNORMAL HIGH (ref 15–41)
Albumin: 3.9 g/dL (ref 3.5–5.0)
Alkaline Phosphatase: 71 U/L (ref 38–126)
Anion gap: 10 (ref 5–15)
BUN: 11 mg/dL (ref 6–20)
CO2: 26 mmol/L (ref 22–32)
Calcium: 9.2 mg/dL (ref 8.9–10.3)
Chloride: 98 mmol/L (ref 98–111)
Creatinine, Ser: 0.85 mg/dL (ref 0.61–1.24)
GFR calc Af Amer: >60 mL/min (ref >60)
GFR calc non Af Amer: >60 mL/min (ref >60)
Glucose, Bld: 127 mg/dL — ABNORMAL HIGH (ref 70–99)
Potassium: 4.2 mmol/L (ref 3.5–5.1)
Sodium: 134 mmol/L — ABNORMAL LOW (ref 135–145)
Total Bilirubin: 0.8 mg/dL (ref 0.3–1.2)
Total Protein: 8 g/dL (ref 6.5–8.1)

## 2020-03-06 LAB — PSA: Prostatic Specific Antigen: 0.35 ng/mL (ref 0.00–4.00)

## 2020-03-06 MED ORDER — LISINOPRIL 20 MG PO TABS: 20.0000 mg | ORAL_TABLET | Freq: Every day | ORAL | 1 refills | Status: DC

## 2020-03-06 NOTE — Progress Notes (Signed)
BP (!) 182/114   Pulse 93   Temp 99.1 F (37.3 C)   Ht 5' 8.75" (1.746 m)   Wt 202 lb 8 oz (91.9 kg)   SpO2 98%   BMI 30.12 kg/m    Subjective:    Patient ID: Troy Hunt, male    DOB: 1971-03-02, 49 y.o.   MRN: [id number]  HPI: Troy Hunt is a 49 y.o. male presenting on 03/06/2020 for New Patient (Initial Visit) (pt is here to establish care and to f/u with his HTN. pt states he is feeling well today)   HPI   Pt had a negative covid 19 screening questionnaire.   Pt is a 38yoM who presents to establish care.  He has had No PCP since he saw Dr Livia Snellen (last seen 2016).  He says he Got lisinopril in ER and took last one yesterday.  He takes ASA daily because he said a doctor told him to when he had avascular necrosis.  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   He says he is feeling well today.   He has not yet gotten covid vaccination.     Relevant past medical, surgical, family and social history reviewed and updated as indicated. Interim medical history since our last visit reviewed. Allergies and medications reviewed and updated.   Current Outpatient Medications:  .  aspirin EC 325 MG EC tablet, Take 1 tablet (325 mg total) by mouth 2 (two) times daily after a meal., Disp: 30 tablet, Rfl: 0 .  lisinopril (ZESTRIL) 20 MG tablet, Take 1 tablet (20 mg total) by mouth daily. (Patient not taking: Reported on 03/06/2020), Disp: 30 tablet, Rfl: 1    Review of Systems  Per HPI unless specifically indicated above     Objective:    BP (!) 182/114   Pulse 93   Temp 99.1 F (37.3 C)   Ht 5' 8.75" (1.746 m)   Wt 202 lb 8 oz (91.9 kg)   SpO2 98%   BMI 30.12 kg/m   Wt Readings from Last 3 Encounters:  03/06/20 202 lb 8 oz (91.9 kg)  12/17/19 212 lb (96.2 kg)  02/11/17 207 lb (93.9 kg)    Physical Exam Vitals reviewed.  Constitutional:      General: He is not in acute distress.    Appearance: He is well-developed.  He is not toxic-appearing.  HENT:     Head: Normocephalic and atraumatic.  Eyes:     Conjunctiva/sclera: Conjunctivae normal.     Pupils: Pupils are equal, round, and reactive to light.  Neck:     Thyroid: No thyromegaly.  Cardiovascular:     Rate and Rhythm: Normal rate and regular rhythm.  Pulmonary:     Effort: Pulmonary effort is normal.     Breath sounds: Normal breath sounds. No wheezing or rales.  Abdominal:     General: Bowel sounds are normal.     Palpations: Abdomen is soft. There is no mass.     Tenderness: There is no abdominal tenderness.  Musculoskeletal:     Cervical back: Neck supple.     Right lower leg: No edema.     Left lower leg: No edema.  Lymphadenopathy:     Cervical: No cervical adenopathy.  Skin:    General: Skin is warm and dry.     Findings: No rash.  Neurological:     Mental Status: He is alert and oriented to person,  place, and time.     Motor: No weakness or tremor.     Gait: Gait is intact.     Deep Tendon Reflexes:     Reflex Scores:      Patellar reflexes are 2+ on the right side and 2+ on the left side. Psychiatric:        Attention and Perception: Attention normal.        Speech: Speech normal.        Behavior: Behavior normal. Behavior is cooperative.            Assessment & Plan:   Encounter Diagnoses  Name Primary?  . Encounter to establish care Yes  . Essential hypertension   . Hyperlipidemia, unspecified hyperlipidemia type   . Screening for malignant neoplasm of prostate   . Screening for colon cancer      -Refilled lisinopril for HTN -pt was given ifobt for colon cancer screening -will update Labs -encouraged pt to get covid vaccination -pt to follow up in office in 4 wk.  He is to contact office sooner prn

## 2020-03-08 ENCOUNTER — Other Ambulatory Visit: Payer: Self-pay | Admitting: Physician Assistant

## 2020-03-08 DIAGNOSIS — Z1211 Encounter for screening for malignant neoplasm of colon: Secondary | ICD-10-CM

## 2020-04-04 ENCOUNTER — Ambulatory Visit: Payer: Self-pay | Admitting: Physician Assistant

## 2020-04-12 ENCOUNTER — Ambulatory Visit: Payer: Self-pay | Admitting: Physician Assistant

## 2020-05-03 ENCOUNTER — Ambulatory Visit: Payer: Self-pay | Admitting: Physician Assistant

## 2020-05-08 ENCOUNTER — Ambulatory Visit: Payer: Self-pay | Admitting: Physician Assistant

## 2020-05-08 ENCOUNTER — Encounter: Payer: Self-pay | Admitting: Physician Assistant

## 2020-05-08 ENCOUNTER — Other Ambulatory Visit: Payer: Self-pay

## 2020-05-08 VITALS — BP 176/102 | HR 107 | Temp 99.1°F | Ht 68.75 in | Wt 202.8 lb

## 2020-05-08 DIAGNOSIS — I10 Essential (primary) hypertension: Secondary | ICD-10-CM

## 2020-05-08 DIAGNOSIS — R7989 Other specified abnormal findings of blood chemistry: Secondary | ICD-10-CM

## 2020-05-08 DIAGNOSIS — E785 Hyperlipidemia, unspecified: Secondary | ICD-10-CM

## 2020-05-08 MED ORDER — LISINOPRIL 20 MG PO TABS: 20.0000 mg | ORAL_TABLET | Freq: Every day | ORAL | 1 refills | Status: DC

## 2020-05-08 NOTE — Patient Instructions (Signed)

## 2020-05-08 NOTE — Progress Notes (Signed)
BP (!) 176/102   Pulse (!) 107   Temp 99.1 F (37.3 C)   Ht 5' 8.75" (1.746 m)   Wt 202 lb 12.8 oz (92 kg)   SpO2 98%   BMI 30.17 kg/m    Subjective:    Patient ID: Troy Hunt, male    DOB: 07/22/1971, 49 y.o.   MRN: [id number]  HPI: Troy Hunt is a 49 y.o. male presenting on 05/08/2020 for Hypertension (pt reports checking his bp at home with it running 172/114 highest 120/80 lowest. last time he check his bp was Thursday 155/101. pt took his last lisinopril yesterday and has no refills)   HPI     Pt had a negative covid 19 screening questionnaire.   Chief Complaint  Patient presents with  . Hypertension    pt reports checking his bp at home with it running 172/114 highest 120/80 lowest. last time he check his bp was Thursday 155/101. pt took his last lisinopril yesterday and has no refills   Pt is 49yoM with follow-up for HTN.  He was scheduled to come in for this last month but was a no-show.  He is feeling well today.  He has not yet gotten covid vaccination but has been thinking about it because his wife just got her first dose.     Relevant past medical, surgical, family and social history reviewed and updated as indicated. Interim medical history since our last visit reviewed. Allergies and medications reviewed and updated.  CURRENT MEDS None  (lisinopril 20mg  qd until yesterday)  Review of Systems  Per HPI unless specifically indicated above     Objective:    BP (!) 176/102   Pulse (!) 107   Temp 99.1 F (37.3 C)   Ht 5' 8.75" (1.746 m)   Wt 202 lb 12.8 oz (92 kg)   SpO2 98%   BMI 30.17 kg/m   Wt Readings from Last 3 Encounters:  05/08/20 202 lb 12.8 oz (92 kg)  03/06/20 202 lb 8 oz (91.9 kg)  12/17/19 212 lb (96.2 kg)    Physical Exam Vitals reviewed.  Constitutional:      General: He is not in acute distress.    Appearance: He is well-developed. He is not ill-appearing.  HENT:     Head: Normocephalic and atraumatic.   Cardiovascular:     Rate and Rhythm: Normal rate and regular rhythm.  Pulmonary:     Effort: Pulmonary effort is normal.     Breath sounds: Normal breath sounds. No wheezing.  Abdominal:     General: Bowel sounds are normal.     Palpations: Abdomen is soft.     Tenderness: There is no abdominal tenderness.  Musculoskeletal:     Cervical back: Neck supple.     Right lower leg: No edema.     Left lower leg: No edema.  Lymphadenopathy:     Cervical: No cervical adenopathy.  Skin:    General: Skin is warm and dry.  Neurological:     Mental Status: He is alert and oriented to person, place, and time.  Psychiatric:        Behavior: Behavior normal.     Results for orders placed or performed during the hospital encounter of 03/06/20  PSA  Result Value Ref Range   Prostatic Specific Antigen 0.35 0.00 - 4.00 ng/mL  Lipid panel  Result Value Ref Range   Cholesterol 198 0 - 200 mg/dL   Triglycerides 79 03/08/20 mg/dL  HDL 61 >40 mg/dL   Total CHOL/HDL Ratio 3.2 RATIO   VLDL 16 0 - 40 mg/dL   LDL Cholesterol 086 (H) 0 - 99 mg/dL  Comprehensive metabolic panel  Result Value Ref Range   Sodium 134 (L) 135 - 145 mmol/L   Potassium 4.2 3.5 - 5.1 mmol/L   Chloride 98 98 - 111 mmol/L   CO2 26 22 - 32 mmol/L   Glucose, Bld 127 (H) 70 - 99 mg/dL   BUN 11 6 - 20 mg/dL   Creatinine, Ser 5.78 0.61 - 1.24 mg/dL   Calcium 9.2 8.9 - 46.9 mg/dL   Total Protein 8.0 6.5 - 8.1 g/dL   Albumin 3.9 3.5 - 5.0 g/dL   AST 62 (H) 15 - 41 U/L   ALT 59 (H) 0 - 44 U/L   Alkaline Phosphatase 71 38 - 126 U/L   Total Bilirubin 0.8 0.3 - 1.2 mg/dL   GFR calc non Af Amer >60 >60 mL/min   GFR calc Af Amer >60 >60 mL/min   Anion gap 10 5 - 15      Assessment & Plan:    Encounter Diagnoses  Name Primary?  . Essential hypertension Yes  . Hyperlipidemia, unspecified hyperlipidemia type   . Elevated LFTs       -reviewed labs with pt -pt is counseled on lowfat diet and exercise for lipids -will  monitor LFTs.  Pt counseled on avoiding APAP and etoh -encouraged pt to avoid running out of his bp medication which increases risks for bad events like heart attacks, stroket, etc -encouraged pt to get covid vaccination -pt to follow up 1 month to recheck BP.  He is to to contact office sooner prn

## 2020-06-05 ENCOUNTER — Ambulatory Visit: Payer: Self-pay | Admitting: Physician Assistant

## 2020-06-08 ENCOUNTER — Other Ambulatory Visit: Payer: Self-pay | Admitting: Physician Assistant

## 2020-07-12 ENCOUNTER — Other Ambulatory Visit: Payer: Self-pay | Admitting: Physician Assistant

## 2020-07-12 MED ORDER — LISINOPRIL 20 MG PO TABS: ORAL_TABLET | ORAL | 0 refills | Status: DC

## 2020-07-20 ENCOUNTER — Ambulatory Visit: Payer: Self-pay | Admitting: Physician Assistant

## 2020-07-25 ENCOUNTER — Ambulatory Visit: Payer: Self-pay | Admitting: Physician Assistant

## 2020-07-25 ENCOUNTER — Encounter: Payer: Self-pay | Admitting: Physician Assistant

## 2020-07-25 ENCOUNTER — Other Ambulatory Visit: Payer: Self-pay

## 2020-07-25 VITALS — BP 150/100 | HR 92 | Temp 98.4°F | Ht 68.75 in | Wt 204.9 lb

## 2020-07-25 DIAGNOSIS — R7989 Other specified abnormal findings of blood chemistry: Secondary | ICD-10-CM

## 2020-07-25 DIAGNOSIS — M25551 Pain in right hip: Secondary | ICD-10-CM

## 2020-07-25 DIAGNOSIS — I10 Essential (primary) hypertension: Secondary | ICD-10-CM

## 2020-07-25 DIAGNOSIS — R7309 Other abnormal glucose: Secondary | ICD-10-CM

## 2020-07-25 DIAGNOSIS — Z96641 Presence of right artificial hip joint: Secondary | ICD-10-CM

## 2020-07-25 MED ORDER — LISINOPRIL 20 MG PO TABS: ORAL_TABLET | ORAL | 4 refills | Status: DC

## 2020-07-25 MED ORDER — AMLODIPINE BESYLATE 5 MG PO TABS: 5.0000 mg | ORAL_TABLET | Freq: Every day | ORAL | 4 refills | Status: DC

## 2020-07-25 NOTE — Patient Instructions (Addendum)
Get  Xray Get labs/blood Submit [facility] financial application Get covid vaccination Return colon cancer screening test meds- lisinopril and amlodipine
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
## 2020-08-16 ENCOUNTER — Ambulatory Visit: Payer: Self-pay | Admitting: Orthopaedic Surgery

## 2020-08-24 ENCOUNTER — Ambulatory Visit: Payer: Self-pay | Admitting: Orthopaedic Surgery

## 2020-08-28 ENCOUNTER — Ambulatory Visit: Payer: Self-pay | Admitting: Orthopaedic Surgery

## 2020-09-06 ENCOUNTER — Ambulatory Visit: Payer: Self-pay | Admitting: Orthopaedic Surgery

## 2020-09-06 ENCOUNTER — Telehealth: Payer: Self-pay | Admitting: Orthopaedic Surgery

## 2020-09-06 NOTE — Telephone Encounter (Signed)
Returned call to patient left message to call back. 

## 2020-09-21 ENCOUNTER — Ambulatory Visit: Payer: Self-pay | Admitting: Orthopaedic Surgery

## 2020-10-05 ENCOUNTER — Telehealth: Payer: Self-pay

## 2020-10-05 NOTE — Telephone Encounter (Signed)
Pt contacted [employer] to be advised on next steps to forward his documents in order to go ahead and get his CAFA application processed.   Pt also was asked of any other needs to be addressed and he stated none.  Pt was also advised of awareness of covid testing kits initiative,  if he received a covid vaccine and he was provided instructions on what to do when ready to receive.    Plan  _Pt was advised to mail the food stamp eligibility document that was previously requested to accompany his CAFA application.   -Per request of pt, he wanted the [employer]  mailing address to be sent by text message in addition to the Covid kits initiative electronic link sign up form  -Pt stated he is considering covid vaccine, but when ready will call back to get [place] to assist with scheduling.  Testing link was also sent in addition to the other link   Pt understood and call ended

## 2020-10-11 ENCOUNTER — Other Ambulatory Visit: Payer: Self-pay | Admitting: Physician Assistant

## 2020-10-11 DIAGNOSIS — I10 Essential (primary) hypertension: Secondary | ICD-10-CM

## 2020-10-11 DIAGNOSIS — E785 Hyperlipidemia, unspecified: Secondary | ICD-10-CM

## 2020-10-11 DIAGNOSIS — R7989 Other specified abnormal findings of blood chemistry: Secondary | ICD-10-CM

## 2020-10-11 DIAGNOSIS — R7309 Other abnormal glucose: Secondary | ICD-10-CM

## 2020-10-25 ENCOUNTER — Ambulatory Visit: Payer: Self-pay | Admitting: Physician Assistant

## 2020-10-26 ENCOUNTER — Encounter: Payer: Self-pay | Admitting: Physician Assistant

## 2020-11-25 ENCOUNTER — Other Ambulatory Visit: Payer: Self-pay | Admitting: Physician Assistant

## 2020-11-28 ENCOUNTER — Ambulatory Visit: Payer: Self-pay | Admitting: Physician Assistant

## 2020-12-28 ENCOUNTER — Other Ambulatory Visit: Payer: Self-pay | Admitting: Physician Assistant

## 2021-11-08 ENCOUNTER — Ambulatory Visit: Payer: Self-pay | Admitting: Physician Assistant

## 2022-10-07 ENCOUNTER — Ambulatory Visit: Payer: Medicaid Other | Attending: Physician Assistant
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
## 2022-11-06 ENCOUNTER — Ambulatory Visit: Payer: Medicaid Other

## 2022-11-12 ENCOUNTER — Ambulatory Visit: Payer: Medicaid Other | Attending: Physician Assistant

## 2022-11-12 ENCOUNTER — Other Ambulatory Visit: Payer: Self-pay

## 2022-11-12 DIAGNOSIS — M5459 Other low back pain: Secondary | ICD-10-CM

## 2022-11-12 DIAGNOSIS — M6281 Muscle weakness (generalized): Secondary | ICD-10-CM | POA: Insufficient documentation

## 2022-11-12 DIAGNOSIS — M25551 Pain in right hip: Secondary | ICD-10-CM | POA: Diagnosis present
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
## 2022-11-25 ENCOUNTER — Ambulatory Visit: Payer: Medicaid Other | Attending: Physician Assistant

## 2023-11-11 ENCOUNTER — Encounter (HOSPITAL_COMMUNITY): Payer: Self-pay | Admitting: Emergency Medicine

## 2023-11-11 ENCOUNTER — Emergency Department (HOSPITAL_COMMUNITY)
Admission: EM | Admit: 2023-11-11 | Discharge: 2023-11-11 | Disposition: A | Discharge status: Home / Self Care | Payer: 59 | Attending: Emergency Medicine | Admitting: Emergency Medicine

## 2023-11-11 ENCOUNTER — Other Ambulatory Visit: Payer: Self-pay

## 2023-11-11 DIAGNOSIS — I10 Essential (primary) hypertension: Secondary | ICD-10-CM | POA: Diagnosis not present

## 2023-11-11 DIAGNOSIS — D696 Thrombocytopenia, unspecified: Secondary | ICD-10-CM | POA: Insufficient documentation

## 2023-11-11 DIAGNOSIS — Z7982 Long term (current) use of aspirin: Secondary | ICD-10-CM | POA: Diagnosis not present

## 2023-11-11 DIAGNOSIS — K625 Hemorrhage of anus and rectum: Secondary | ICD-10-CM | POA: Diagnosis present

## 2023-11-11 DIAGNOSIS — Z79899 Other long term (current) drug therapy: Secondary | ICD-10-CM | POA: Insufficient documentation

## 2023-11-11 DIAGNOSIS — K610 Anal abscess: Secondary | ICD-10-CM | POA: Diagnosis not present

## 2023-11-11 DIAGNOSIS — Z7984 Long term (current) use of oral hypoglycemic drugs: Secondary | ICD-10-CM | POA: Diagnosis not present

## 2023-11-11 DIAGNOSIS — R739 Hyperglycemia, unspecified: Secondary | ICD-10-CM | POA: Insufficient documentation

## 2023-11-11 LAB — CBC WITH DIFFERENTIAL/PLATELET
Abs Immature Granulocytes: 0.01 10*3/uL (ref 0.00–0.07)
Basophils Absolute: 0 10*3/uL (ref 0.0–0.1)
Basophils Relative: 1 %
Eosinophils Absolute: 0.1 10*3/uL (ref 0.0–0.5)
Eosinophils Relative: 3 %
HCT: 40.1 % (ref 39.0–52.0)
Hemoglobin: 14.1 g/dL (ref 13.0–17.0)
Immature Granulocytes: 0 %
Lymphocytes Relative: 32 %
Lymphs Abs: 1.3 10*3/uL (ref 0.7–4.0)
MCH: 32.7 pg (ref 26.0–34.0)
MCHC: 35.2 g/dL (ref 30.0–36.0)
MCV: 93 fL (ref 80.0–100.0)
Monocytes Absolute: 0.6 10*3/uL (ref 0.1–1.0)
Monocytes Relative: 14 %
Neutro Abs: 2 10*3/uL (ref 1.7–7.7)
Neutrophils Relative %: 50 %
Platelets: 72 10*3/uL — ABNORMAL LOW (ref 150–400)
RBC: 4.31 MIL/uL (ref 4.22–5.81)
RDW: 11.9 % (ref 11.5–15.5)
Smear Review: DECREASED
WBC: 4 10*3/uL (ref 4.0–10.5)
nRBC: 0 % (ref 0.0–0.2)

## 2023-11-11 LAB — COMPREHENSIVE METABOLIC PANEL
ALT: 31 U/L (ref 0–44)
AST: 44 U/L — ABNORMAL HIGH (ref 15–41)
Albumin: 3.5 g/dL (ref 3.5–5.0)
Alkaline Phosphatase: 79 U/L (ref 38–126)
Anion gap: 9 (ref 5–15)
BUN: 8 mg/dL (ref 6–20)
CO2: 24 mmol/L (ref 22–32)
Calcium: 8.8 mg/dL — ABNORMAL LOW (ref 8.9–10.3)
Chloride: 99 mmol/L (ref 98–111)
Creatinine, Ser: 0.81 mg/dL (ref 0.61–1.24)
GFR, Estimated: >60 mL/min (ref >60)
Glucose, Bld: 216 mg/dL — ABNORMAL HIGH (ref 70–99)
Potassium: 3.8 mmol/L (ref 3.5–5.1)
Sodium: 132 mmol/L — ABNORMAL LOW (ref 135–145)
Total Bilirubin: 1.4 mg/dL — ABNORMAL HIGH (ref 0.0–1.2)
Total Protein: 7.8 g/dL (ref 6.5–8.1)

## 2023-11-11 MED ORDER — CIPROFLOXACIN HCL 500 MG PO TABS: 500.0000 mg | ORAL_TABLET | Freq: Two times a day (BID) | ORAL | 0 refills | Status: DC

## 2023-11-11 MED ORDER — METRONIDAZOLE 500 MG PO TABS
500.0000 mg | ORAL_TABLET | Freq: Once | ORAL | Status: AC
  Administered 2023-11-11: 500 mg via ORAL
  Filled 2023-11-11: qty 1

## 2023-11-11 MED ORDER — METFORMIN HCL 500 MG PO TABS: 500.0000 mg | ORAL_TABLET | Freq: Two times a day (BID) | ORAL | 0 refills | Status: AC

## 2023-11-11 MED ORDER — METRONIDAZOLE 500 MG PO TABS: 500.0000 mg | ORAL_TABLET | Freq: Two times a day (BID) | ORAL | 0 refills | Status: DC

## 2023-11-11 MED ORDER — KETOROLAC TROMETHAMINE 15 MG/ML IJ SOLN
15.0000 mg | Freq: Once | INTRAMUSCULAR | Status: AC
  Administered 2023-11-11: 15 mg via INTRAMUSCULAR
  Filled 2023-11-11: qty 1

## 2023-11-11 MED ORDER — CIPROFLOXACIN HCL 250 MG PO TABS
500.0000 mg | ORAL_TABLET | Freq: Once | ORAL | Status: AC
  Administered 2023-11-11: 500 mg via ORAL
  Filled 2023-11-11: qty 2

## 2023-11-11 MED ORDER — METRONIDAZOLE 500 MG PO TABS: 500.0000 mg | ORAL_TABLET | Freq: Three times a day (TID) | ORAL | 0 refills | Status: AC

## 2023-11-11 MED ORDER — CIPROFLOXACIN HCL 500 MG PO TABS: 500.0000 mg | ORAL_TABLET | Freq: Two times a day (BID) | ORAL | 0 refills | Status: AC

## 2023-11-11 NOTE — ED Triage Notes (Signed)
 Pt reports rectal bleeding x 2 weeks. At times pt is bleeding without going to the bathroom. Pt reports bright red blood. Pt denies constipation or hemorrhoids. Pt reports pain 10/10.

## 2023-11-11 NOTE — ED Provider Notes (Signed)
 [place] [facility] DE[facility]MENT AT [facility] Provider Note   CSN: [id number] Arrival date & time: 11/11/23  1050     History {Add pertinent medical, surgical, social history, OB history to HPI:1} Chief Complaint  Patient presents with   Rectal Bleeding    Troy Hunt is a 53 y.o. male.  He reports PMH of hypertension.  Presents the ER today complaining of bloody drainage from his rectal area x 2 weeks.  He states it is constantly draining, not associated with bowel movements, not having constipation or diarrhea, no blood with bowel movements.  No fevers or chills.  He denies dizziness, weakness, nausea or vomiting, no abdominal pain.  No urinary symptoms.  He is on blood thinners.   Rectal Bleeding      Home Medications Prior to Admission medications   Medication Sig Start Date End Date Taking? Authorizing Provider  metFORMIN (GLUCOPHAGE) 500 MG tablet Take 1 tablet (500 mg total) by mouth 2 (two) times daily with a meal. 11/11/23 12/11/23 Yes Dianna Ewald A, PA-C  amLODipine (NORVASC) 5 MG tablet TAKE ONE (1) TABLET EACH DAY 01/01/21  Yes Jacquelin Hawking, PA-C  aspirin EC 325 MG EC tablet Take 1 tablet (325 mg total) by mouth 2 (two) times daily after a meal. Patient not taking: Reported on 07/25/2020 02/11/14   Kathryne Hitch, MD  ciprofloxacin (CIPRO) 500 MG tablet Take 1 tablet (500 mg total) by mouth every 12 (twelve) hours for 7 days. 11/11/23 11/18/23  Carmel Sacramento A, PA-C  lisinopril (ZESTRIL) 20 MG tablet TAKE ONE (1) TABLET EACH DAY 01/01/21   Jacquelin Hawking, PA-C  metroNIDAZOLE (FLAGYL) 500 MG tablet Take 1 tablet (500 mg total) by mouth 3 (three) times daily for 7 days. 11/11/23 11/18/23  Carmel Sacramento A, PA-C  oxyCODONE-acetaminophen (PERCOCET) 7.5-325 MG tablet Take 1 tablet by mouth every 4 (four) hours as needed.    [provider]      Allergies    Darvocet [propoxyphene n-acetaminophen], Ultracet [tramadol-acetaminophen],  and Ultram [tramadol]    Review of Systems   Review of Systems  Gastrointestinal:  Positive for hematochezia.    Physical Exam Updated Vital Signs BP (!) 149/85 (BP Location: Right Arm)   Pulse 99   Temp 98.8 F (37.1 C) (Temporal)   Resp 18   Ht 5\' 8"  (1.727 m)   Wt 97.1 kg   SpO2 99%   BMI 32.54 kg/m  Physical Exam Vitals and nursing note reviewed.  Constitutional:      General: He is not in acute distress.    Appearance: He is well-developed.  HENT:     Head: Normocephalic and atraumatic.     Mouth/Throat:     Mouth: Mucous membranes are moist.  Eyes:     Extraocular Movements: Extraocular movements intact.     Conjunctiva/sclera: Conjunctivae normal.     Pupils: Pupils are equal, round, and reactive to light.  Cardiovascular:     Rate and Rhythm: Normal rate and regular rhythm.     Heart sounds: No murmur heard. Pulmonary:     Effort: Pulmonary effort is normal. No respiratory distress.     Breath sounds: Normal breath sounds.  Abdominal:     Palpations: Abdomen is soft.     Tenderness: There is no abdominal tenderness.  Genitourinary:    Comments: Exam chaperoned by RN Tinnie Gens.  Patient has approximately 1 cm diameter perianal abscess with active purulent with minimal bloody drainage at the 12 o'clock position.  No surrounding induration or erythema at this time.  No anal fissures noted, no hemorrhoids noted Musculoskeletal:        General: No swelling.     Cervical back: Neck supple.  Skin:    General: Skin is warm and dry.     Capillary Refill: Capillary refill takes less than 2 seconds.  Neurological:     General: No focal deficit present.     Mental Status: He is alert and oriented to person, place, and time.  Psychiatric:        Mood and Affect: Mood normal.     ED Results / Procedures / Treatments   Labs (all labs ordered are listed, but only abnormal results are displayed) Labs Reviewed  CBC WITH DIFFERENTIAL/PLATELET - Abnormal; Notable for  the following components:      Result Value   Platelets 72 (*)    All other components within normal limits  COMPREHENSIVE METABOLIC PANEL - Abnormal; Notable for the following components:   Sodium 132 (*)    Glucose, Bld 216 (*)    Calcium 8.8 (*)    AST 44 (*)    Total Bilirubin 1.4 (*)    All other components within normal limits    EKG None  Radiology No results found.  Procedures Procedures  {Document cardiac monitor, telemetry assessment procedure when appropriate:1}  Medications Ordered in ED Medications  ciprofloxacin (CIPRO) tablet 500 mg (500 mg Oral Given 11/11/23 1440)  metroNIDAZOLE (FLAGYL) tablet 500 mg (500 mg Oral Given 11/11/23 1440)  ketorolac (TORADOL) 15 MG/ML injection 15 mg (15 mg Intramuscular Given 11/11/23 1440)    ED Course/ Medical Decision Making/ A&P   {   Click here for ABCD2, HEART and other calculatorsREFRESH Note before signing :1}                              Medical Decision Making This patient presents to the ED for concern of bloody drainage from rectal area x 2 weeks, this involves an extensive number of treatment options, and is a complaint that carries with it a high risk of complications and morbidity.  The differential diagnosis includes anal fissure, hemorrhoid, perianal abscess, pilonidal abscess, diverticulitis, anal fistula, other   Co morbidities that complicate the patient evaluation  Patient reports PCP recently told his platelets were low   Additional history obtained:  Additional history obtained from EMR External records from outside source obtained and reviewed including prior notes and labs.   Lab Tests:  I Ordered, and personally interpreted labs.  The pertinent results include: CBC is normal except for thrombocytopenia; CMP shows glucose slightly elevated at 216, very mild hyponatremia, slightly elevated bilirubin at 1.4    Problem List / ED Course / Critical interventions / Medication management  Patient  worried about bloody drainage from his rectal area x 2 weeks.  On exam the drainage has minimal blood but is purulent coming from an open perianal abscess with no surrounding induration or erythema.  As it is actively draining, do not feel he needs I&D or imaging at this time.  His labs are reassuring.  Will treat him with antibiotics and have him follow-up closely with PCP and/or surgery.  Advised on sitz bath's.  Patient made aware of his blood sugar over 216, will start on metformin and have a follow-up close with PCP.  Also noted his low platelets, he is already following with his PCP for this and  advised to follow-up closely with them.  He also likely referral to hematology.  He has appoint with PCP on March 10 and plans to address these issues at this time.  He is advised to come back if he has fever, increased redness, swelling or pain or other worrisome changes.  At the end of the visit his daughter came and stated that she was also worried because he had a syncopal episode at home a couple of weeks ago, she was worried it was due to blood loss, he discussed his normal hemoglobin and this is very unlikely.  Patient does not know what happened, states he woke up on the ground.  Denies biting his tongue, denies any injuries, has not been having headaches or any neurologic symptoms.  He did not lose control with bowel or bladder, this has not happened since and is never happened the past.  EKG today shows sinus rhythm, normal intervals.  Discussed close PCP follow-up and strict return precautions for this as well.  Given this with this relatively remotely I do not feel further workup or admission would be indicated at this time. I ordered medication including Toradol for pain Reevaluation of the patient after these medicines showed that the patient improved I have reviewed the patients home medicines and have made adjustments as needed      Amount and/or Complexity of Data Reviewed Labs:  ordered.  Risk Prescription drug management.   ***  {Document critical care time when appropriate:1} {Document review of labs and clinical decision tools ie heart score, Chads2Vasc2 etc:1}  {Document your independent review of radiology images, and any outside records:1} {Document your discussion with family members, caretakers, and with consultants:1} {Document social determinants of health affecting pt's care:1} {Document your decision making why or why not admission, treatments were needed:1} Final Clinical Impression(s) / ED Diagnoses Final diagnoses:  Perianal abscess  Thrombocytopenia (HCC)  Hyperglycemia    Rx / DC Orders ED Discharge Orders          Ordered    ciprofloxacin (CIPRO) 500 MG tablet  Every 12 hours,   Status:  Discontinued        11/11/23 1406    metroNIDAZOLE (FLAGYL) 500 MG tablet  2 times daily,   Status:  Discontinued        11/11/23 1406    metroNIDAZOLE (FLAGYL) 500 MG tablet  3 times daily        11/11/23 1406    ciprofloxacin (CIPRO) 500 MG tablet  Every 12 hours        11/11/23 1406    metFORMIN (GLUCOPHAGE) 500 MG tablet  2 times daily with meals        11/11/23 1407

## 2023-11-11 NOTE — Discharge Instructions (Addendum)
 It was a pleasure taking care of you today.  You were evaluated in the ER for bloody drainage from your rectal area.  This is consistent with a small abscess and we restart you on antibiotics.  Do the warm soaks several times a day to encourage continued drainage.  Follow-up closely with your PCP and/or general surgery for a wound check.  If you start having increased redness or swelling, increased drainage, fever or chills or any other worrisome changes you need to come back to the ER right away.  Your results showed platelets were low and your blood sugar was 216.  Blood sugar over 200 consistent with diabetes.  We restart you on a medication to help control your blood sugars called metformin.  Follow-up closely with PCP for this.  Is also discussed with them the episode of passing out you had a couple of weeks ago.  EKG today was normal and your labs are reassuring but if it happens again you need to come back to the ER right away.  It is important to follow-up closely with your primary care doctor and/or any specialist as discussed.  Come back to the ER if you have any new or worsening symptoms.
